# Patient Record
Sex: Female | Born: 1955 | Race: White | Hispanic: No | Marital: Married | Smoking: Never smoker
Health system: Southern US, Community
[De-identification: ages and names within clinical notes are randomized; demographics above are authoritative.]

---

## 1997-12-02 ENCOUNTER — Other Ambulatory Visit: Admission: RE | Admit: 1997-12-02 | Discharge: 1997-12-02 | Payer: Self-pay | Admitting: *Deleted

## 1998-07-29 ENCOUNTER — Ambulatory Visit (HOSPITAL_COMMUNITY): Admission: RE | Admit: 1998-07-29 | Discharge: 1998-07-29 | Payer: Self-pay | Admitting: Family Medicine

## 1998-07-29 ENCOUNTER — Encounter: Payer: Self-pay | Admitting: Family Medicine

## 1998-12-08 ENCOUNTER — Other Ambulatory Visit: Admission: RE | Admit: 1998-12-08 | Discharge: 1998-12-08 | Payer: Self-pay | Admitting: *Deleted

## 1999-12-14 ENCOUNTER — Other Ambulatory Visit: Admission: RE | Admit: 1999-12-14 | Discharge: 1999-12-14 | Payer: Self-pay | Admitting: Obstetrics and Gynecology

## 2001-01-29 ENCOUNTER — Other Ambulatory Visit: Admission: RE | Admit: 2001-01-29 | Discharge: 2001-01-29 | Payer: Self-pay | Admitting: Obstetrics and Gynecology

## 2001-12-26 ENCOUNTER — Other Ambulatory Visit: Admission: RE | Admit: 2001-12-26 | Discharge: 2001-12-26 | Payer: Self-pay | Admitting: Obstetrics and Gynecology

## 2002-12-30 ENCOUNTER — Other Ambulatory Visit: Admission: RE | Admit: 2002-12-30 | Discharge: 2002-12-30 | Payer: Self-pay | Admitting: Obstetrics and Gynecology

## 2003-12-31 ENCOUNTER — Other Ambulatory Visit: Admission: RE | Admit: 2003-12-31 | Discharge: 2003-12-31 | Payer: Self-pay | Admitting: Obstetrics and Gynecology

## 2005-02-09 ENCOUNTER — Other Ambulatory Visit: Admission: RE | Admit: 2005-02-09 | Discharge: 2005-02-09 | Payer: Self-pay | Admitting: Obstetrics and Gynecology

## 2006-05-23 ENCOUNTER — Other Ambulatory Visit: Admission: RE | Admit: 2006-05-23 | Discharge: 2006-05-23 | Payer: Self-pay | Admitting: Obstetrics and Gynecology

## 2008-09-10 ENCOUNTER — Ambulatory Visit (HOSPITAL_COMMUNITY): Admission: RE | Admit: 2008-09-10 | Discharge: 2008-09-10 | Payer: Self-pay | Admitting: Obstetrics and Gynecology

## 2008-09-10 ENCOUNTER — Encounter (INDEPENDENT_AMBULATORY_CARE_PROVIDER_SITE_OTHER): Payer: Self-pay | Admitting: Obstetrics and Gynecology

## 2009-10-12 ENCOUNTER — Ambulatory Visit: Payer: Self-pay | Admitting: Vascular Surgery

## 2009-11-17 ENCOUNTER — Ambulatory Visit: Payer: Self-pay | Admitting: Vascular Surgery

## 2010-09-05 ENCOUNTER — Other Ambulatory Visit: Payer: Self-pay | Admitting: Dermatology

## 2010-10-19 LAB — CBC
HCT: 43.2 % (ref 36.0–46.0)
Hemoglobin: 14.5 g/dL (ref 12.0–15.0)
MCHC: 33.7 g/dL (ref 30.0–36.0)
MCV: 97.4 fL (ref 78.0–100.0)
Platelets: 155 10*3/uL (ref 150–400)
RBC: 4.43 MIL/uL (ref 3.87–5.11)
RDW: 13.2 % (ref 11.5–15.5)
WBC: 4 10*3/uL (ref 4.0–10.5)

## 2010-10-19 LAB — PREGNANCY, URINE: Preg Test, Ur: NEGATIVE

## 2010-10-26 ENCOUNTER — Other Ambulatory Visit: Payer: Self-pay | Admitting: Dermatology

## 2010-11-21 NOTE — Op Note (Signed)
NAME:  Patty [name], Patty [name] NO.:  [id number]   MEDICAL RECORD NO.:  [id number]          PATIENT TYPE:  AMB   LOCATION:  SDC                           FACILITY:  [facility]   PHYSICIAN:  Crist Fat. Rivard, M.D. DATE OF BIRTH:  03-Aug-1955   DATE OF PROCEDURE:  09/10/2008  DATE OF DISCHARGE:                               OPERATIVE REPORT   PREOPERATIVE DIAGNOSIS:  Dysfunctional uterine bleeding with endometrial  polyps.   POSTOPERATIVE DIAGNOSIS:  Dysfunctional uterine bleeding with  endometrial polyps.   ANESTHESIA:  IV sedation and paracervical block.   PROCEDURES:  Hysteroscopy, resection of endometrial polyps, and dilation  and curettage.   SURGEON:  Crist Fat. Rivard, MD.   ASSISTANT:  None.   ESTIMATED BLOOD LOSS:  Minimal.   PROCEDURE:  After being informed of the planned procedure with possible  complications including bleeding, infection, and injury to uterus and  possibly intra-abdominal organs, informed consent was obtained.  The  patient was taken to OR #7, given IV sedation and paracervical block  without any complication.  She was placed in the lithotomy position,  prepped and draped in a sterile fashion, and her bladder was emptied  with an in-and-out red rubber catheter.  Pelvic exam revealed a  retroverted uterus normal in size and shape, 2 normal adnexa.   A weighted speculum was inserted in the vagina.  Anterior lip of the  cervix was grasped with the tenaculum forceps replaced with a Jacobs  forceps due to a cervical laceration.  Uterus was sounded at 8.5 cm and  the cervix was easily dilated using Hegar dilator until #33, which  allows easy entry of operative hysteroscope.  With a perfusion of  sorbitol 3% at a maximum pressure of 80 mmHg, we are able to visualize  the entire uterine cavity with both tubal ostia.  We note a fundal polyp  measuring approximately 1 x 0.5 cm and 2 lower uterine segment polyps on  the posterior wall of the uterus  measuring respectively 0.5 and 0.8 cm.  Using the resectoscope, these polyps are easily removed and sent  separately to Pathology.  Hysteroscope was then removed and a sharp  curette was used to curette the rest of the endometrial cavity removing  a small amount of normal-appearing endometrium.  Instruments were then  removed and the cervical laceration was repaired with a running locked  suture of 3-0 Vicryl and a figure-of-eight stitch of 3-0 Vicryl.  Instrument and sponge count was complete x2.  Estimated blood loss was  minimal.  Water deficit was 50 mL.  The procedure was very well tolerated by the  patient who was taken to recovery room in a well and stable condition  and will be discharged home with instructions.   SPECIMENS:  Endometrial polyps and endometrial curettings sent to  Pathology.      Crist Fat Rivard, M.D.  Electronically Signed     SAR/MEDQ  D:  09/10/2008  T:  09/11/2008  Job:  [id number]

## 2010-11-21 NOTE — Consult Note (Signed)
NEW PATIENT CONSULTATION   Patty [name]  DOB:  [birth year]                                       10/12/2009  EAVWU#:[id number]   The patient presents today for evaluation of pain in her popliteal fossa  bilaterally with reticular varicosities.  She reports that she has had  treatment in the past by Dr. Marcy [name] with both sclerotherapy and at  one point did have ligation and avulsion of [name] small varix in her left  proximal calf.  She does not have any history of DVT and no significant  lower extremity swelling.  She has not had any bleeding from these.   PAST MEDICAL HISTORY:  Her past history is otherwise completely  unremarkable.  She did have polyps removed from her uterus [name] year ago.   SOCIAL HISTORY:  She is married with three children.  She works as [name]  Runner, broadcasting/film/video.   FAMILY HISTORY:  Is significant for varicosities in both maternal and  paternal grandparents.   REVIEW OF SYSTEMS:  No weight loss or weight gain.  Her weight is 140  pounds.  She is 5 feet 9 inches tall.  She denies any cardiac, vascular,  GI, GU symptoms.  She does report pain as discussed above with the  popliteal space with prolonged standing which she does as [name] Runner, broadcasting/film/video.  Neurologic, musculoskeletal, psychiatric, ENT, hematologic and skin are  all negative.   PHYSICAL EXAMINATION:  General:  [name] well-developed, well-nourished white  female appearing her stated age in no acute distress.  Vital signs:  Blood pressure is 103/64, pulse 57, respirations 14.  HEENT:  Normal.  Musculoskeletal:  No major deformities or cyanosis.  Neurological:  No  focal weakness or paresthesias.  Skin:  Without ulcers or rashes.  She  does not have any large varicosities and she does not have any swelling  or changes of venous hypertension.  She does have reticular varicosities  over the posterior popliteal pulses bilaterally.   She underwent noninvasive vascular laboratory studies in our office and  this  reveals some reflux in her right great saphenous vein although the  size is small and this does not in my opinion appear to be significant.  She does not have any reflux in her left great saphenous vein or on her  small saphenous veins on either side.   I discussed options with the patient.  She has achieved relief in the  past with symptom discomfort with sclerotherapy of these reticulars in  her posterior popliteal fossa.  I have recommended that we proceed with  sclerotherapy of these for symptom relief.  I explained that I do not  feel that there is any evidence of any dangerous situation and that  observation alone could be possible as well.  She does report that she  is having significant discomfort with this and wishes to proceed with  treatment.  We will schedule this with Patty Hoof, RN and I explained to  the patient that Patty [name] is doing the sclerotherapy in our office.  We will  plan this at her convenience.     Patty [name], M.D.  Electronically Signed   TFE/MEDQ  D:  10/12/2009  T:  10/13/2009  Job:  [id number]   cc:   Patty [name]

## 2010-11-21 NOTE — Procedures (Signed)
LOWER EXTREMITY VENOUS REFLUX EXAM   INDICATION:  Bilateral lower extremity pain and varicosities.   EXAM:  Using color-flow imaging and pulse Doppler spectral analysis, the  bilateral common femoral, superficial femoral, popliteal, posterior  tibial, greater and lesser saphenous veins are evaluated.  There is no  evidence suggesting deep venous insufficiency in the bilateral lower  extremities.   The bilateral saphenofemoral junctions are competent. The right GSV is  not competent with Reflux of >563milliseconds with the caliber as  described below.   The bilateral proximal short saphenous vein demonstrates competency.   GSV Diameter (used if found to be incompetent only)                                            Right    Left  Proximal Greater Saphenous Vein           0.70 cm  cm  Proximal-to-mid-thigh                     0.52 cm  cm  Mid thigh                                 0.36 cm  cm  Mid-distal thigh                          cm       cm  Distal thigh                              0.36 cm  cm  Knee                                      0.31 cm  cm   IMPRESSION:  1. Right greater saphenous vein Reflux is identified with the caliber      ranging from 0.31 cm to 0.70 cm knee to groin.  2. The right greater saphenous vein is not aneurysmal.  3. The right greater saphenous vein is not tortuous.  4. The deep venous system is competent.  5. The bilateral lesser saphenous veins are competent.  6. Incompetent perforator veins are identified and contributing to      varicosities.  The right posterior calf gastrocnemius incompetent      perforator measuring 0.48 cm.  The left medial distal thigh      measuring 0.36 cm.   ___________________________________________  Larina Earthly, M.D.   CJ/MEDQ  D:  10/12/2009  T:  10/12/2009  Job:  [id number]

## 2012-03-17 NOTE — Progress Notes (Signed)
Quick Note:  The recent mammogram is incomplete / abnormal suggesting a close follow-up / additional images. When is the next appointment to follow-up on this mammogram?  Please document in chart. ______ 

## 2012-03-18 ENCOUNTER — Telehealth: Payer: Self-pay

## 2012-03-18 NOTE — Telephone Encounter (Signed)
Message copied by Larwance Rote on Tue Mar 18, 2012  2:41 PM ------      Message from: Patty [name]      Created: Mon Mar 17, 2012  6:40 PM       The recent mammogram is incomplete / abnormal suggesting a close follow-up / additional images.      When is the next appointment to follow-up on this mammogram?       Please document in chart.

## 2012-03-18 NOTE — Telephone Encounter (Signed)
LMTC regarding f/u mmg.  ld

## 2012-03-19 ENCOUNTER — Telehealth: Payer: Self-pay

## 2012-03-19 NOTE — Telephone Encounter (Signed)
Pt states that she had u/s one week ago and Dr Yolanda Bonine said it was benign cyst and will recheck in March.  Will forward to SR for information.  ld

## 2012-06-18 ENCOUNTER — Ambulatory Visit (INDEPENDENT_AMBULATORY_CARE_PROVIDER_SITE_OTHER): Payer: BC Managed Care – PPO | Admitting: Obstetrics and Gynecology

## 2012-06-18 ENCOUNTER — Encounter: Payer: Self-pay | Admitting: Obstetrics and Gynecology

## 2012-06-18 VITALS — BP 82/66 | Wt 149.0 lb

## 2012-06-18 DIAGNOSIS — L0591 Pilonidal cyst without abscess: Secondary | ICD-10-CM | POA: Insufficient documentation

## 2012-06-18 DIAGNOSIS — Z124 Encounter for screening for malignant neoplasm of cervix: Secondary | ICD-10-CM

## 2012-06-18 DIAGNOSIS — Z01419 Encounter for gynecological examination (general) (routine) without abnormal findings: Secondary | ICD-10-CM

## 2012-06-18 MED ORDER — NYSTATIN-TRIAMCINOLONE 100000-0.1 UNIT/GM-% EX OINT: TOPICAL_OINTMENT | Freq: Three times a day (TID) | CUTANEOUS | Status: DC | PRN

## 2012-06-18 NOTE — Addendum Note (Signed)
Addended by: Silverio Lay on: 06/18/2012 11:36 AM   Modules accepted: Orders

## 2012-06-18 NOTE — Progress Notes (Signed)
The patient is not taking hormone replacement therapy The patient  is taking a Calcium supplement. Post-menopausal bleeding:no  Last Pap: was normal December  2012 Last mammogram: was normal August  2013 Last DEXA scan : na Last colonoscopy:normal May 2012  Urinary symptoms: none Normal bowel movements: Yes Reports abuse at home: No:   Pt reports no problems.  Possible irritation from sitting a lot.  No discharge.   Subjective:    Patty [name] is a 56 y.o. female No obstetric history on file. who presents for annual exam.  The patient has no complaints today.   The following portions of the patient's history were reviewed and updated as appropriate: allergies, current medications, past family history, past medical history, past social history, past surgical history and problem list.  Review of Systems Pertinent items are noted in HPI. Gastrointestinal:No change in bowel habits, no abdominal pain, no rectal bleeding Genitourinary:negative for dysuria, frequency, hematuria, nocturia and urinary incontinence    Objective:     BP 82/66  Wt 149 lb (67.586 kg)  Weight:  Wt Readings from Last 1 Encounters:  06/18/12 149 lb (67.586 kg)     BMI: There is no height on file to calculate BMI. General Appearance: Alert, appropriate appearance for age. No acute distress HEENT: Grossly normal Neck / Thyroid: Supple, no masses, nodes or enlargement Lungs: clear to auscultation bilaterally Back: No CVA tenderness Breast Exam: Normal to inspection and No masses or nodes.No dimpling, nipple retraction or discharge. Cardiovascular: Regular rate and rhythm. S1, S2, no murmur Gastrointestinal: Soft, non-tender, no masses or organomegaly Pelvic Exam: Vulva and vagina appear normal. Bimanual exam reveals normal uterus and adnexa.RV Rectovaginal: normal rectal, no masses Lymphatic Exam: Non-palpable nodes in neck, clavicular, axillary, or inguinal regions Skin: no rash or  abnormalities Neurologic: Normal gait and speech, no tremor  Psychiatric: Alert and oriented, appropriate affect.    Assessment:    Normal gyn exam    Plan:   mammogram pap smear return annually or prn Vaginal Irritation from sitting discussed Vitamin D and Calcium discussed  DEXA at age 26 reccommended   Silverio Lay MD

## 2012-06-20 ENCOUNTER — Other Ambulatory Visit: Payer: Self-pay

## 2012-06-20 LAB — PAP IG W/ RFLX HPV ASCU

## 2012-11-25 ENCOUNTER — Other Ambulatory Visit: Payer: Self-pay

## 2013-11-24 ENCOUNTER — Other Ambulatory Visit: Payer: Self-pay

## 2014-05-10 ENCOUNTER — Encounter: Payer: Self-pay | Admitting: Obstetrics and Gynecology

## 2014-05-21 ENCOUNTER — Other Ambulatory Visit: Payer: Self-pay

## 2014-08-13 ENCOUNTER — Ambulatory Visit: Payer: Self-pay | Admitting: Cardiology

## 2014-09-06 ENCOUNTER — Ambulatory Visit: Payer: Self-pay | Admitting: Cardiology

## 2014-09-14 ENCOUNTER — Encounter: Payer: Self-pay | Admitting: Cardiology

## 2017-09-10 ENCOUNTER — Ambulatory Visit: Payer: BC Managed Care – PPO | Admitting: Vascular Surgery

## 2017-09-10 ENCOUNTER — Encounter: Payer: Self-pay | Admitting: Vascular Surgery

## 2017-09-10 VITALS — BP 117/74 | HR 73 | Temp 98.0°F | Resp 16 | Ht 69.0 in | Wt 146.0 lb

## 2017-09-10 DIAGNOSIS — I83893 Varicose veins of bilateral lower extremities with other complications: Secondary | ICD-10-CM

## 2017-09-10 NOTE — Progress Notes (Signed)
[facility] [place]  Patient name: Patty [name] MRN: [id number] DOB: 1956-02-23 Sex: female  REASON FOR CONSULT: Valuation of varicose veins left greater than right  HPI: Patty [name] is a 62 y.o. female, who is here today for evaluation of lower extremity varicosities.  She has a past history of phlebectomy of a prominent varicosity in her left popliteal fossa.  She is now had progressive changes with marked venous varicosities extending throughout her left thigh down onto her calf as well.  She reports an achy sensation with these.  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  Of DVT and no bleeding.  She has worn compression in the past but not currently.  History reviewed. No pertinent past medical history.  Family History  Problem Relation Age of Onset  . Cancer Mother 78       brain tumor  . Cancer Father 67       colon, liver    SOCIAL HISTORY: Social History   Socioeconomic History  . Marital status: Married    Spouse name: Not on file  . Number of children: Not on file  . Years of education: Not on file  . Highest education level: Not on file  Social Needs  . Financial resource strain: Not on file  . Food insecurity - worry: Not on file  . Food insecurity - inability: Not on file  . Transportation needs - medical: Not on file  . Transportation needs - non-medical: Not on file  Occupational History  . Not on file  Tobacco Use  . Smoking status: Never Smoker  . Smokeless tobacco: Never Used  Substance and Sexual Activity  . Alcohol use: No  . Drug use: No  . Sexual activity: Yes  Other Topics Concern  . Not on file  Social History Narrative  . Not on file    No Known Allergies  Current Outpatient Medications  Medication Sig Dispense Refill  . nystatin-triamcinolone ointment (MYCOLOG) Apply topically 3 (three) times daily as needed. (Patient not taking: Reported on 09/10/2017) 60  g 0   No current facility-administered medications for this visit.     REVIEW OF SYSTEMS:  [X]  denotes positive finding, [ ]  denotes negative finding Cardiac  Comments:  Chest pain or chest pressure:    Shortness of breath upon exertion:    Short of breath when lying flat:    Irregular heart rhythm:        Vascular    Pain in calf, thigh, or hip brought on by ambulation:    Pain in feet at night that wakes you up from your sleep:     Blood clot in your veins:    Leg swelling:  x       Pulmonary    Oxygen at home:    Productive cough:     Wheezing:         Neurologic    Sudden weakness in arms or legs:     Sudden numbness in arms or legs:     Sudden onset of difficulty speaking or slurred speech:    Temporary loss of vision in one eye:     Problems with dizziness:         Gastrointestinal    Blood in stool:     Vomited blood:         Genitourinary    Burning when urinating:     Blood in urine:  Psychiatric    Major depression:         Hematologic    Bleeding problems:    Problems with blood clotting too easily:        Skin    Rashes or ulcers:        Constitutional    Fever or chills:      PHYSICAL EXAM: Vitals:   09/10/17 1014  BP: 117/74  Pulse: 73  Resp: 16  Temp: 98 F (36.7 C)  SpO2: 100%  Weight: 146 lb (66.2 kg)  Height: 5\' 9"  (1.753 m)    GENERAL: The patient is a well-nourished female, in no acute distress. The vital signs are documented above. CARDIOVASCULAR: 2+ dorsalis pedis pulses bilaterally heart varicosities over her left thigh and calf.  Scattered telangiectasia over both lower extremities PULMONARY: There is good air exchange  ABDOMEN: Soft and non-tender  MUSCULOSKELETAL: There are no major deformities or cyanosis. NEUROLOGIC: No focal weakness or paresthesias are detected. SKIN: There are no ulcers or rashes noted. PSYCHIATRIC: The patient has a normal affect.  DATA:  She did not have a formal venous reflux study  today.  I imaged her left great saphenous vein with SonoSite ultrasound.  This reveals extensive varicosities arising off and enlarged refluxing great saphenous vein  MEDICAL ISSUES: Explained the significance of this and need for elevation and compression.  We will fit her today with thigh-high graduated compression garments.  I will see her again in 3 months to determine if this is being adequate treatment for her pain.  She would be a candidate for laser ablation and stab phlebectomy   Larina Earthlyodd F. Dorisann Schwanke, MD FACS [facility] Office Tel [phone] Pager [phone]

## 2017-09-11 ENCOUNTER — Other Ambulatory Visit: Payer: Self-pay

## 2017-09-11 DIAGNOSIS — I83893 Varicose veins of bilateral lower extremities with other complications: Secondary | ICD-10-CM

## 2017-12-31 ENCOUNTER — Ambulatory Visit: Payer: BC Managed Care – PPO | Admitting: Vascular Surgery

## 2017-12-31 ENCOUNTER — Ambulatory Visit (HOSPITAL_COMMUNITY)
Admission: RE | Admit: 2017-12-31 | Discharge: 2017-12-31 | Disposition: A | Discharge status: Home / Self Care | Payer: BC Managed Care – PPO | Source: Ambulatory Visit | Attending: Vascular Surgery | Admitting: Vascular Surgery

## 2017-12-31 ENCOUNTER — Encounter: Payer: Self-pay | Admitting: Vascular Surgery

## 2017-12-31 VITALS — BP 109/64 | HR 68 | Temp 98.0°F | Resp 16 | Ht 69.0 in | Wt 146.0 lb

## 2017-12-31 DIAGNOSIS — I83893 Varicose veins of bilateral lower extremities with other complications: Secondary | ICD-10-CM | POA: Diagnosis present

## 2017-12-31 DIAGNOSIS — I825Z3 Chronic embolism and thrombosis of unspecified deep veins of distal lower extremity, bilateral: Secondary | ICD-10-CM | POA: Diagnosis not present

## 2017-12-31 NOTE — Progress Notes (Signed)
    [facility] [place]  Patient name: Patty [name] MRN: [id number] DOB: 05/15/1956 Sex: female  REASON FOR VISIT: Left lower extremity varicosities left greater than right low up  HPI: Patty DiamondLaura A Oren is a 62 y.o. female here today for follow-up.  She has been compliant with her graduated compression garments but continues to have discomfort in her left leg with extensive varicosities.  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  History reviewed. No pertinent past medical history.  Family History  Problem Relation Age of Onset  . Cancer Mother 1168       brain tumor  . Cancer Father 7368       colon, liver    SOCIAL HISTORY: Social History   Tobacco Use  . Smoking status: Never Smoker  . Smokeless tobacco: Never Used  Substance Use Topics  . Alcohol use: No    No Known Allergies  Current Outpatient Medications  Medication Sig Dispense Refill  . nystatin-triamcinolone ointment (MYCOLOG) Apply topically 3 (three) times daily as needed. (Patient not taking: Reported on 09/10/2017) 60 g 0   No current facility-administered medications for this visit.     REVIEW OF SYSTEMS:  [X]  denotes positive finding, [ ]  denotes negative finding Cardiac  Comments:  Chest pain or chest pressure:    Shortness of breath upon exertion:    Short of breath when lying flat:    Irregular heart rhythm:        Vascular    Pain in calf, thigh, or hip brought on by ambulation:    Pain in feet at night that wakes you up from your sleep:     Blood clot in your veins:    Leg swelling:  x         PHYSICAL EXAM: Vitals:   12/31/17 1313  BP: 109/64  Pulse: 68  Resp: 16  Temp: 98 F (36.7 C)  SpO2: 99%  Weight: 146 lb (66.2 kg)  Height: 5\' 9"  (1.753 m)    GENERAL: The patient is a well-nourished female, in no acute distress. The vital signs are documented above. CARDIOVASCULAR: Plus dorsalis pedis  pulses bilaterally.  Extensive varicosities over her distal thigh and medial calf. PULMONARY: There is good air exchange  MUSCULOSKELETAL: There are no major deformities or cyanosis. NEUROLOGIC: No focal weakness or paresthesias are detected. SKIN: There are no ulcers or rashes noted. PSYCHIATRIC: The patient has a normal affect.  DATA:  Formal venous duplex today was reviewed with the patient.  I also reimage her veins with SonoSite ultrasound.  This does show extensive varicosities arising from her refluxing great saphenous vein from her saphenofemoral junction distally.  MEDICAL ISSUES: Failed conservative treatment.  I have recommended laser ablation of her left great saphenous vein and stab phlebectomy of multiple tributary varicosities.  She understands this is an outpatient procedure under local anesthesia.  She understands that Dr. Edilia Boickson will be doing the procedure and we will coordinate this around her work schedule    Larina Earthlyodd F. Vernal Rutan, MD [facility] Office Tel [phone] Pager [phone]

## 2018-03-19 ENCOUNTER — Ambulatory Visit: Payer: BC Managed Care – PPO | Admitting: Vascular Surgery

## 2018-03-19 ENCOUNTER — Encounter: Payer: Self-pay | Admitting: Vascular Surgery

## 2018-03-19 VITALS — BP 108/69 | HR 64 | Temp 98.0°F | Resp 16 | Ht 69.0 in | Wt 142.0 lb

## 2018-03-19 DIAGNOSIS — I83893 Varicose veins of bilateral lower extremities with other complications: Secondary | ICD-10-CM

## 2018-03-19 NOTE — Progress Notes (Signed)
Patient name: Patty [name] MRN: [id number] DOB: Dec 23, 1955 Sex: female  REASON FOR VISIT:   Follow-up of varicose veins.  HPI:   Patty [name] is a pleasant 62 y.o. female who was last seen in our office on 12/31/2017 by Dr. Arbie Cookey.  The patient was having significant discomfort in both lower extremities but especially on the left related to her varicose veins.  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  On exam she had extensive varicosities over her distal thigh and medial calf.  The patient had extensive varicosities on duplex arising from a refluxing great saphenous vein from the saphenofemoral junction distally.  She is referred back to consider endovenous laser ablation.  The patient continues to have significant aching pain in both lower extremities but especially on the left side.  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  The thigh-high compression stockings have not helped.  She also has been elevating her legs some takes ibuprofen as needed for pain.  Current Outpatient Medications  Medication Sig Dispense Refill  . nystatin-triamcinolone ointment (MYCOLOG) Apply topically 3 (three) times daily as needed. (Patient not taking: Reported on 09/10/2017) 60 g 0   No current facility-administered medications for this visit.     REVIEW OF SYSTEMS:  [X]  denotes positive finding, [ ]  denotes negative finding Vascular    Leg swelling    Cardiac    Chest pain or chest pressure:    Shortness of breath upon exertion:    Short of breath when lying flat:    Irregular heart rhythm:    Constitutional    Fever or chills:     PHYSICAL EXAM:   Vitals:   03/19/18 0843  BP: 108/69  Pulse: 64  Resp: 16  Temp: 98 F (36.7 C)  Weight: 142 lb (64.4 kg)  Height: 5\' 9"  (1.753 m)    GENERAL: The patient is a well-nourished female, in no acute distress. The vital signs are documented above. CARDIOVASCULAR: There is a regular rate  and rhythm. PULMONARY: There is good air exchange bilaterally without wheezing or rales. VASCULAR: Arterial: She has palpable pedal pulses. VENOUS: She has some large dilated varicose veins along the medial aspect of her left calf extending anteriorly to the lateral left calf.  She also has spider veins and reticular veins in the distal thighs bilaterally and proximal calves bilaterally. I did visualize the great saphenous vein on the left with the SonoSite in the proximal saphenous vein is significantly dilated with reflux.  In the mid thigh it gives off a large anterior branch.  The vein becomes smaller distally but is clearly dilated in the thigh.  DATA:   VENOUS REFLUX STUDY: I reviewed her previous venous reflux study.  On the left side there was no evidence of DVT.  There was some small chronic thrombus in the small saphenous vein in the proximal calf.  There was reflux in the deep system on the left involving the common femoral vein and popliteal vein.  There was reflux in the great saphenous vein on the left in the proximal thigh distal thigh and at the knee.  MEDICAL ISSUES:   CHRONIC VENOUS INSUFFICIENCY: This patient continues to have significant aching pain and heaviness in both lower extremities but especially on the left.  Patient has  CEAP clinical class II venous disease and has tried conservative measures.  For this reason I think she would be a candidate  for endovenous laser ablation of the left great saphenous vein in the thigh before the large branch becomes superficial.  Below that the vein becomes smaller.  She would also be a candidate for 10-20 stab phlebectomies and 1 unit of sclerotherapy.   I have discussed the indications for endovenous laser ablation of the left GSV, that is to lower the pressure in the veins and potentially help relieve the symptoms from venous hypertension. I have also discussed alternative options including conservative treatment with leg elevation,  compression therapy, exercise, avoiding prolonged sitting and standing, and weight management. I have discussed the potential complications of the procedure, including, but not limited to: bleeding, bruising, leg swelling, nerve injury, skin burns, significant pain from phlebitis, deep venous thrombosis, or failure of the vein to close.  I have also explained that venous insufficiency is a chronic disease, and that the patient is at risk for recurrent varicose veins in the future.  All of the patient's questions were encouraged and answered. I have discussed with the patient the indications for stab phlebectomy.  I have explained to the patient that that will have small scars from the stab incisions.  I explained that the other risks include leg swelling, bruising, bleeding, and phlebitis.  All the patient's questions were encouraged and answered and they are agreeable to proceed.  She also understands that chronic venous insufficiency is a chronic problem and we have also discussed some lifestyle changes to help prevent progression of her venous disease.  Waverly Ferrari [facility] [phone]

## 2018-03-25 ENCOUNTER — Other Ambulatory Visit: Payer: Self-pay | Admitting: *Deleted

## 2018-03-25 DIAGNOSIS — I83893 Varicose veins of bilateral lower extremities with other complications: Secondary | ICD-10-CM

## 2018-04-24 ENCOUNTER — Other Ambulatory Visit: Payer: BC Managed Care – PPO | Admitting: Vascular Surgery

## 2018-04-30 ENCOUNTER — Encounter (HOSPITAL_COMMUNITY): Payer: BC Managed Care – PPO

## 2018-04-30 ENCOUNTER — Ambulatory Visit: Payer: BC Managed Care – PPO | Admitting: Vascular Surgery

## 2018-06-11 ENCOUNTER — Telehealth: Payer: Self-pay | Admitting: *Deleted

## 2018-06-11 NOTE — Telephone Encounter (Signed)
Patient called in to let me know that she needs to cancel her laser ablation scheduled for 12/12. Last week her brother was diagnosed with DVT and PE's. He has some kind of clotting factor problem. She has been advised to get herself checked out. I have cancelled her and told her we would need to re-precert her for next year. She wants me to pencil her in on 2/27 at 11am, and she will be back in touch after seeing her hematologist.

## 2018-06-19 ENCOUNTER — Other Ambulatory Visit: Payer: BC Managed Care – PPO | Admitting: Vascular Surgery

## 2018-06-26 ENCOUNTER — Encounter (HOSPITAL_COMMUNITY): Payer: BC Managed Care – PPO

## 2018-06-26 ENCOUNTER — Ambulatory Visit: Payer: BC Managed Care – PPO | Admitting: Vascular Surgery

## 2018-08-15 ENCOUNTER — Encounter: Payer: Self-pay | Admitting: Hematology

## 2018-08-15 ENCOUNTER — Telehealth: Payer: Self-pay | Admitting: Hematology

## 2018-08-15 NOTE — Telephone Encounter (Signed)
A new patient appt has been scheduled for the pt to see Dr. Candise Che on 3/4 at 10am. Pt has agreed tot he appt date and time. Aware to arrive 30 minutes early. Letter mailed.

## 2018-09-10 ENCOUNTER — Telehealth: Payer: Self-pay | Admitting: Hematology

## 2018-09-10 ENCOUNTER — Inpatient Hospital Stay: Payer: BC Managed Care – PPO | Attending: Hematology | Admitting: Hematology

## 2018-09-10 ENCOUNTER — Telehealth: Payer: Self-pay | Admitting: Oncology

## 2018-09-10 NOTE — Telephone Encounter (Signed)
Per MD request do not reschedule patient appt without his permission.

## 2018-09-10 NOTE — Telephone Encounter (Signed)
Created in error

## 2018-09-12 ENCOUNTER — Telehealth: Payer: Self-pay | Admitting: Hematology

## 2018-09-12 NOTE — Telephone Encounter (Signed)
Pt cld stating she forgot about her appt on 3/4 with Dr. Candise Che. Pt has been rescheduled to see Dr. Candise Che on 3/23 at 1pm.

## 2018-09-25 ENCOUNTER — Telehealth: Payer: Self-pay | Admitting: Hematology

## 2018-09-25 NOTE — Telephone Encounter (Signed)
Pt cld to reschedule appt with Dr. Candise Che on 5/4 at 1pm.

## 2018-09-29 ENCOUNTER — Inpatient Hospital Stay: Payer: BC Managed Care – PPO | Admitting: Hematology

## 2018-11-06 ENCOUNTER — Telehealth: Payer: Self-pay | Admitting: Hematology

## 2018-11-06 NOTE — Telephone Encounter (Signed)
Pt has cld to reschedule her appt to 6/4 at 1pm d/t COVID-19

## 2018-11-10 ENCOUNTER — Encounter: Payer: BC Managed Care – PPO | Admitting: Hematology

## 2018-12-10 ENCOUNTER — Other Ambulatory Visit: Payer: BC Managed Care – PPO

## 2018-12-10 ENCOUNTER — Encounter: Payer: BC Managed Care – PPO | Admitting: Hematology

## 2019-10-27 ENCOUNTER — Telehealth: Payer: Self-pay | Admitting: Hematology

## 2019-10-27 NOTE — Telephone Encounter (Signed)
Received a new hem referral from Dr. Azucena Cecil for fhx of blood clots. Patty [name] has been cld and scheduled to see Dr. Azucena Cecil on 5/17 at 1pm. Pt aware to arrive 15 minutes early.

## 2019-11-23 ENCOUNTER — Other Ambulatory Visit: Payer: Self-pay

## 2019-11-23 ENCOUNTER — Inpatient Hospital Stay: Payer: BC Managed Care – PPO | Attending: Hematology | Admitting: Hematology

## 2019-11-23 ENCOUNTER — Inpatient Hospital Stay: Payer: BC Managed Care – PPO

## 2019-11-23 VITALS — BP 137/74 | HR 64 | Temp 98.3°F | Resp 18 | Ht 69.0 in | Wt 147.4 lb

## 2019-11-23 DIAGNOSIS — Z148 Genetic carrier of other disease: Secondary | ICD-10-CM | POA: Insufficient documentation

## 2019-11-23 DIAGNOSIS — Z8249 Family history of ischemic heart disease and other diseases of the circulatory system: Secondary | ICD-10-CM

## 2019-11-23 DIAGNOSIS — Z800 Family history of malignant neoplasm of digestive organs: Secondary | ICD-10-CM | POA: Insufficient documentation

## 2019-11-23 DIAGNOSIS — Z808 Family history of malignant neoplasm of other organs or systems: Secondary | ICD-10-CM | POA: Insufficient documentation

## 2019-11-23 DIAGNOSIS — Z8719 Personal history of other diseases of the digestive system: Secondary | ICD-10-CM | POA: Insufficient documentation

## 2019-11-23 DIAGNOSIS — Z832 Family history of diseases of the blood and blood-forming organs and certain disorders involving the immune mechanism: Secondary | ICD-10-CM | POA: Insufficient documentation

## 2019-11-23 DIAGNOSIS — D6869 Other thrombophilia: Secondary | ICD-10-CM | POA: Diagnosis present

## 2019-11-23 DIAGNOSIS — I8390 Asymptomatic varicose veins of unspecified lower extremity: Secondary | ICD-10-CM | POA: Diagnosis not present

## 2019-11-23 DIAGNOSIS — D6859 Other primary thrombophilia: Secondary | ICD-10-CM

## 2019-11-23 LAB — CMP (CANCER CENTER ONLY)
ALT: 21 U/L (ref 0–44)
AST: 25 U/L (ref 15–41)
Albumin: 4.5 g/dL (ref 3.5–5.0)
Alkaline Phosphatase: 69 U/L (ref 38–126)
Anion gap: 8 (ref 5–15)
BUN: 8 mg/dL (ref 8–23)
CO2: 30 mmol/L (ref 22–32)
Calcium: 9.8 mg/dL (ref 8.9–10.3)
Chloride: 104 mmol/L (ref 98–111)
Creatinine: 0.77 mg/dL (ref 0.44–1.00)
GFR, Est AFR Am: >60 mL/min (ref >60)
GFR, Estimated: >60 mL/min (ref >60)
Glucose, Bld: 94 mg/dL (ref 70–99)
Potassium: 3.9 mmol/L (ref 3.5–5.1)
Sodium: 142 mmol/L (ref 135–145)
Total Bilirubin: 1 mg/dL (ref 0.3–1.2)
Total Protein: 7.6 g/dL (ref 6.5–8.1)

## 2019-11-23 LAB — CBC WITH DIFFERENTIAL/PLATELET
Abs Immature Granulocytes: 0.01 10*3/uL (ref 0.00–0.07)
Basophils Absolute: 0.1 10*3/uL (ref 0.0–0.1)
Basophils Relative: 2 %
Eosinophils Absolute: 0.2 10*3/uL (ref 0.0–0.5)
Eosinophils Relative: 4 %
HCT: 40.7 % (ref 36.0–46.0)
Hemoglobin: 13.7 g/dL (ref 12.0–15.0)
Immature Granulocytes: 0 %
Lymphocytes Relative: 27 %
Lymphs Abs: 1.1 10*3/uL (ref 0.7–4.0)
MCH: 31.1 pg (ref 26.0–34.0)
MCHC: 33.7 g/dL (ref 30.0–36.0)
MCV: 92.5 fL (ref 80.0–100.0)
Monocytes Absolute: 0.3 10*3/uL (ref 0.1–1.0)
Monocytes Relative: 7 %
Neutro Abs: 2.5 10*3/uL (ref 1.7–7.7)
Neutrophils Relative %: 60 %
Platelets: 179 10*3/uL (ref 150–400)
RBC: 4.4 MIL/uL (ref 3.87–5.11)
RDW: 12.5 % (ref 11.5–15.5)
WBC: 4.1 10*3/uL (ref 4.0–10.5)
nRBC: 0 % (ref 0.0–0.2)

## 2019-11-23 LAB — D-DIMER, QUANTITATIVE: D-Dimer, Quant: 0.27 ug/mL-FEU (ref 0.00–0.50)

## 2019-11-23 LAB — ANTITHROMBIN III: AntiThromb III Func: 114 % (ref 75–120)

## 2019-11-23 NOTE — Patient Instructions (Signed)
Thank you for choosing [facility] to provide your oncology and hematology care.   Should you have questions after your visit to the [facility] ([facility]), please contact this office at [phone] between 8:30 AM and 4:30 PM.  Voice mails left after 4:00 PM may not be returned until the following business day.  Calls received after 4:30 PM will be answered by an off-site Nurse Triage Line.    Prescription Refills:  Please have your pharmacy contact us directly for most prescription requests.  Contact the office directly for refills of narcotics (pain medications). Allow 48-72 hours for refills.  Appointments: Please contact the [facility] scheduling department [phone] for questions regarding [facility] appointment scheduling.  Contact the schedulers with any scheduling changes so that your appointment can be rescheduled in a timely manner.   Central Scheduling for [place] [phone] - Call to schedule procedures such as PET scans, CT scans, MRI, Ultrasound, etc.  To afford each patient quality time with our providers, please arrive 30 minutes before your scheduled appointment time.  If you arrive late for your appointment, you may be asked to reschedule.  We strive to give you quality time with our providers, and arriving late affects you and other patients whose appointments are after yours. If you are a no show for multiple scheduled visits, you may be dismissed from the clinic at the providers discretion.     Resources: [facility] Social Workers [phone] for additional information on assistance programs or assistance connecting with community support programs   [place] DSS  336-641-3447: Information regarding food stamps, Medicaid, and utility assistance [place] 336-333-6589   [place] [employer]'s shared-ride transportation service for eligible riders who have a disability that prevents them from riding the fixed route bus.   Medicare Rights Center  800-333-4114 Helps people with Medicare understand their rights and benefits, navigate the Medicare system, and secure the quality healthcare they deserve [employer] 800-227-2345 Assists patients locate various types of support and financial assistance Cancer Care: 1-800-813-HOPE (4673) Provides financial assistance, online support groups, medication/co-pay assistance.   Transportation Assistance for appointments at [facility]: Transportation Coordinator [phone]  Again, thank you for choosing [facility] for your care.       

## 2019-11-23 NOTE — Progress Notes (Signed)
HEMATOLOGY/ONCOLOGY CONSULTATION NOTE  Date of Service: 11/23/2019  Patient Care Team: System, Pcp Not In as PCP - General  CHIEF COMPLAINTS/PURPOSE OF CONSULTATION:  FHx of blood clots   HISTORY OF PRESENTING ILLNESS:   Patty [name] is a wonderful 64 y.o. female who has been referred to [place] by Dr. Moreen Fowler for evaluation and management of family history of blood clots. The pt reports that she is doing well overall.   The pt reports that she was scheduled to have laser surgery for varicose veins last year but did not go through with the procedure after her brother was found to have a clotting disorder. Her brother who is a Insurance underwriter and was otherwise very healthy had a massive PE. He tested positive for PAI-1 4G/5G genotype, but she is unsure if he was heterozygous or homozygous for the gene. He was 64 years old at the time. Pt's brother also had a PE after a shoulder surgery 10+ years prior. Pt's father also had a blood clot in the back of his knee whole undergoing chemotherapy for Colon Cancer. Pt denies any other blood clotting or bleeding disorders in the family. She has no history of blood clots and had lower extremity US completed as a part of work up before her scheduled laser surgery.   She had a pilonidal cyst as a teenager. Pt has had three children, all normal vaginal deliveries and no miscarriages. Pt had sclerotherapy completed after her births due to significant varicose veins. She has never been on hormonal birth control. She has no known medication allergies. Pt is currently up-to-date with age-appropriate cancer screenings.   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   On PMHx the pt reports Varicose veins, Pilonidal cyst, Hyperlipidemia, Colonic polyps, Hemorrhoids, Chronic idiopathic constipation. On Social Hx the  pt reports that she is a non-smoker and does not drink alcohol.  On Family Hx the pt reports a brother who has had two PE and a PAI-1 4G/5G mutation. Her father also had a DVT while undergoing chemotherapy.    MEDICAL HISTORY:   Elevated LDL cholesterol level  Hemorrhage of rectum and anus Chronic idiopathic constipation Hemorrhoids  Hyperlipidemia Colonic polyps    SURGICAL HISTORY: No past surgical history on file.  SOCIAL HISTORY: Social History   Socioeconomic History  . Marital status: Married    Spouse name: Not on file  . Number of children: Not on file  . Years of education: Not on file  . Highest education level: Not on file  Occupational History  . Not on file  Tobacco Use  . Smoking status: Never Smoker  . Smokeless tobacco: Never Used  Substance and Sexual Activity  . Alcohol use: No  . Drug use: No  . Sexual activity: Yes  Other Topics Concern  . Not on file  Social History Narrative  . Not on file   Social Determinants of Health   Financial Resource Strain:   . Difficulty of Paying Living Expenses:   Food Insecurity:   . Worried About Charity fundraiser in the Last Year:   . Arboriculturist in the Last Year:   Transportation Needs:   . Film/video editor (Medical):   Marland Kitchen Lack of Transportation (Non-Medical):   Physical Activity:   . Days of Exercise per Week:   . Minutes of  Exercise per Session:   Stress:   . Feeling of Stress :   Social Connections:   . Frequency of Communication with Friends and Family:   . Frequency of Social Gatherings with Friends and Family:   . Attends Religious Services:   . Active Member of Clubs or Organizations:   . Attends Banker Meetings:   Marland Kitchen Marital Status:   Intimate Partner Violence:   . Fear of Current or Ex-Partner:   . Emotionally Abused:   Marland Kitchen Physically Abused:   . Sexually Abused:     FAMILY HISTORY: Family History  Problem Relation Age of Onset  . Cancer Mother 3       brain  tumor  . Cancer Father 54       colon, liver    ALLERGIES:  has No Known Allergies.  MEDICATIONS:  Current Outpatient Medications  Medication Sig Dispense Refill  . nystatin-triamcinolone ointment (MYCOLOG) Apply topically 3 (three) times daily as needed. (Patient not taking: Reported on 09/10/2017) 60 g 0   No current facility-administered medications for this visit.    REVIEW OF SYSTEMS:    10 Point review of Systems was done is negative except as noted above.  PHYSICAL EXAMINATION: ECOG PERFORMANCE STATUS: 0 - Asymptomatic  . Vitals:   11/23/19 1326  BP: 137/74  Pulse: 64  Resp: 18  Temp: 98.3 F (36.8 C)  SpO2: 100%   Filed Weights   11/23/19 1326  Weight: 147 lb 6.4 oz (66.9 kg)   .Body mass index is 21.77 kg/m.  GENERAL:alert, in no acute distress and comfortable SKIN: no acute rashes, no significant lesions EYES: conjunctiva are pink and non-injected, sclera anicteric OROPHARYNX: MMM, no exudates, no oropharyngeal erythema or ulceration NECK: supple, no JVD LYMPH:  no palpable lymphadenopathy in the cervical, axillary or inguinal regions LUNGS: clear to auscultation b/l with normal respiratory effort HEART: regular rate & rhythm ABDOMEN:  normoactive bowel sounds , non tender, not distended. Extremity: no pedal edema PSYCH: alert & oriented x 3 with fluent speech NEURO: no focal motor/sensory deficits  LABORATORY DATA:  I have reviewed the data as listed  . CBC Latest Ref Rng & Units 09/10/2008  WBC 4.0 - 10.5 K/uL 4.0  Hemoglobin 12.0 - 15.0 g/dL 90.2  Hematocrit 40.9 - 46.0 % 43.2  Platelets 150 - 400 K/uL 155    .No flowsheet data found.   RADIOGRAPHIC STUDIES: I have personally reviewed the radiological images as listed and agreed with the findings in the report. No results found.  ASSESSMENT & PLAN:   64 yo with   1.  PAI-1 Gene Heterozygous for 4G/5G mutation    2. Varicose veins   3. 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 For homozygous state would recommend heart healthy diet and low-dose Aspirin daily. -Advised pt that varicose veins can be a risk factor for blood clots -Not unreasonable for pt to have varicose vein ablation procedure. Advised pt that PAI-1 Heterozygous 4G/5G mutation does carry a small risk of blood clots but this mutation has been asymptomatic thus far. Pt may begin low-dose Aspirin after the procedure at the discretion of the physicians performing  the procedure.  -Not unreasonable to complete a full hypercoagulable w/u to r/o other hypercoagulable conditions -Recommend pt stay up-to-date with age-appropriate cancer screenings with PCP -Will see back in 2 weeks via phone -Will get labs today    FOLLOW UP: Labs today Phone visit in 2 weeks with Dr Candise Che   All of the patients questions were answered with apparent satisfaction. The patient knows to call the clinic with any problems, questions or concerns.  I spent counseling the patient face to face. The total time spent in the appointment was 45 minutes and more than 50% was on counseling and direct patient cares.    Wyvonnia Lora MD MS AAHIVMS [facility] Hematology/Oncology Physician [facility]  (Office):       [phone] (Work cell):  [phone] (Fax):           [phone]  11/23/2019 9:26 AM  I, Carollee Herter, am acting as a scribe for Dr. Wyvonnia Lora.   .I have reviewed the above documentation for accuracy and completeness, and I agree with the above. Johney Maine MD

## 2019-11-24 LAB — PROTEIN S ACTIVITY: Protein S Activity: 101 % (ref 63–140)

## 2019-11-24 LAB — CARDIOLIPIN ANTIBODIES, IGG, IGM, IGA
Anticardiolipin IgA: <9 APL U/mL (ref 0–11)
Anticardiolipin IgG: <9 GPL U/mL (ref 0–14)
Anticardiolipin IgM: 13 MPL U/mL — ABNORMAL HIGH (ref 0–12)

## 2019-11-24 LAB — BETA-2-GLYCOPROTEIN I ABS, IGG/M/A
Beta-2 Glyco I IgG: <9 GPI IgG units (ref 0–20)
Beta-2-Glycoprotein I IgA: <9 GPI IgA units (ref 0–25)
Beta-2-Glycoprotein I IgM: 13 GPI IgM units (ref 0–32)

## 2019-11-24 LAB — PROTEIN C ACTIVITY: Protein C Activity: 135 % (ref 73–180)

## 2019-11-24 LAB — LUPUS ANTICOAGULANT PANEL
DRVVT: 33.4 s (ref 0.0–47.0)
PTT Lupus Anticoagulant: 35.4 s (ref 0.0–51.9)

## 2019-11-24 LAB — PROTEIN C, TOTAL: Protein C, Total: 124 % (ref 60–150)

## 2019-11-24 LAB — PROTEIN S, ANTIGEN, FREE: Protein S Ag, Free: 121 % (ref 57–157)

## 2019-11-25 LAB — FACTOR 5 LEIDEN

## 2019-11-27 LAB — PROTHROMBIN GENE MUTATION

## 2019-12-08 ENCOUNTER — Inpatient Hospital Stay: Payer: BC Managed Care – PPO | Attending: Hematology | Admitting: Hematology

## 2019-12-08 DIAGNOSIS — D6859 Other primary thrombophilia: Secondary | ICD-10-CM

## 2019-12-08 DIAGNOSIS — Z1589 Genetic susceptibility to other disease: Secondary | ICD-10-CM | POA: Insufficient documentation

## 2019-12-08 DIAGNOSIS — Z8249 Family history of ischemic heart disease and other diseases of the circulatory system: Secondary | ICD-10-CM | POA: Diagnosis not present

## 2019-12-08 NOTE — Progress Notes (Signed)
HEMATOLOGY/ONCOLOGY CONSULTATION NOTE  Date of Service: 12/08/2019  Patient Care Team: System, Pcp Not In as PCP - General  CHIEF COMPLAINTS/PURPOSE OF CONSULTATION:  FHx of blood clots   HISTORY OF PRESENTING ILLNESS:   Patty [name] is a wonderful 64 y.o. female who has been referred to [place] by Dr. Azucena Cecil for evaluation and management of family history of blood clots. The pt reports that she is doing well overall.   The pt reports that she was scheduled to have laser surgery for varicose veins last year but did not go through with the procedure after her brother was found to have a clotting disorder. Her brother who is a Occupational hygienist and was otherwise very healthy had a massive PE. He tested positive for PAI-1 4G/5G genotype, but she is unsure if he was heterozygous or homozygous for the gene. He was 64 years old at the time. Pt's brother also had a PE after a shoulder surgery 10+ years prior. Pt's father also had a blood clot in the back of his knee whole undergoing chemotherapy for Colon Cancer. Pt denies any other blood clotting or bleeding disorders in the family. She has no history of blood clots and had lower extremity US completed as a part of work up before her scheduled laser surgery.   She had a pilonidal cyst as a teenager. Pt has had three children, all normal vaginal deliveries and no miscarriages. Pt had sclerotherapy completed after her births due to significant varicose veins. She has never been on hormonal birth control. She has no known medication allergies. Pt is currently up-to-date with age-appropriate cancer screenings.   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   On PMHx the pt reports Varicose veins, Pilonidal cyst, Hyperlipidemia, Colonic polyps, Hemorrhoids, Chronic idiopathic constipation. On Social Hx the  pt reports that she is a non-smoker and does not drink alcohol.  On Family Hx the pt reports a brother who has had two PE and a PAI-1 4G/5G mutation. Her father also had a DVT while undergoing chemotherapy.   INTERVAL HISTORY:   I connected with  Virl Diamond on 12/08/19 by telephone and verified that I am speaking with the correct person using two identifiers.   I discussed the limitations of evaluation and management by telemedicine. The patient expressed understanding and agreed to proceed.  Other persons participating in the visit and their role in the encounter:       -Carollee Herter, Medical Scribe  Patient's location: Home Provider's location: [facility] at [employer]  Patty [name] is a wonderful 64 y.o. female who is here for evaluation and management of family history of blood clots. The patient's last visit with [place] was on 11/23/2019. The pt reports that she is doing well overall.  The pt reports that she has been well and has no new concerns.   Lab results (11/23/19) of CBC w/diff and CMP is as follows: all values are WNL. 11/23/2019 Lupus anticoagulant panel is as follows: PTT Lupus Anticoagulant at 35.4, DRVVT at 33.4 11/23/2019 Cardiolipin antibodies, IgG, IgM, IgA is as follows: Anticardiolipin IgG at <9, Anticardiolipin IgM at 13, Anticardiolipin IgA at <9 11/23/2019 Beta-2-glycoprotein i abs, IgG/M/A is as follows: Beta-2 Glyco I IgG at <9, Beta-2-Glycoprotein I IgM at 13, Beta-2-Glycoprotein I IgA at <9 11/23/2019 D-dimer, Quant at 0.27 11/23/2019 Antithrombin III Func at  114 11/23/2019 Protein S Ag, Free at 121 11/23/2019 Protein S Activity at 101 11/23/2019 Protein C, Total at 124 11/23/2019 Protein C Activity at 135 11/23/2019 Prothrombin gene mutation shows "No mutation identified". 11/23/2019 Factor 5 leiden is "Negative (no mutation found)"  On review of systems, pt denies any other symptoms.    MEDICAL HISTORY:   Elevated LDL cholesterol level    Hemorrhage of rectum and anus Chronic idiopathic constipation Hemorrhoids  Hyperlipidemia Colonic polyps    SURGICAL HISTORY: No past surgical history on file.  SOCIAL HISTORY: Social History   Socioeconomic History  . Marital status: Married    Spouse name: Not on file  . Number of children: Not on file  . Years of education: Not on file  . Highest education level: Not on file  Occupational History  . Not on file  Tobacco Use  . Smoking status: Never Smoker  . Smokeless tobacco: Never Used  Substance and Sexual Activity  . Alcohol use: No  . Drug use: No  . Sexual activity: Yes  Other Topics Concern  . Not on file  Social History Narrative  . Not on file   Social Determinants of Health   Financial Resource Strain:   . Difficulty of Paying Living Expenses:   Food Insecurity:   . Worried About Charity fundraiser in the Last Year:   . Arboriculturist in the Last Year:   Transportation Needs:   . Film/video editor (Medical):   Marland Kitchen Lack of Transportation (Non-Medical):   Physical Activity:   . Days of Exercise per Week:   . Minutes of Exercise per Session:   Stress:   . Feeling of Stress :   Social Connections:   . Frequency of Communication with Friends and Family:   . Frequency of Social Gatherings with Friends and Family:   . Attends Religious Services:   . Active Member of Clubs or Organizations:   . Attends Archivist Meetings:   Marland Kitchen Marital Status:   Intimate Partner Violence:   . Fear of Current or Ex-Partner:   . Emotionally Abused:   Marland Kitchen Physically Abused:   . Sexually Abused:     FAMILY HISTORY: Family History  Problem Relation Age of Onset  . Cancer Mother 33       brain tumor  . Cancer Father 24       colon, liver    ALLERGIES:  is allergic to no known allergies.  MEDICATIONS:  No current outpatient medications on file.   No current facility-administered medications for this visit.    REVIEW OF SYSTEMS:   A 10+ POINT  REVIEW OF SYSTEMS WAS OBTAINED including neurology, dermatology, psychiatry, cardiac, respiratory, lymph, extremities, GI, GU, Musculoskeletal, constitutional, breasts, reproductive, HEENT.  All pertinent positives are noted in the HPI.  All others are negative.   PHYSICAL EXAMINATION: ECOG PERFORMANCE STATUS: 0 - Asymptomatic  .Telehealth visit  LABORATORY DATA:  I have reviewed the data as listed  . CBC Latest Ref Rng & Units 11/23/2019 09/10/2008  WBC 4.0 - 10.5 K/uL 4.1 4.0  Hemoglobin 12.0 - 15.0 g/dL 13.7 14.5  Hematocrit 36.0 - 46.0 % 40.7 43.2  Platelets 150 - 400 K/uL 179 155    . CMP Latest Ref Rng & Units 11/23/2019  Glucose 70 - 99 mg/dL 94  BUN 8 - 23 mg/dL 8  Creatinine 0.44 - 1.00 mg/dL 0.77  Sodium 135 - 145 mmol/L 142  Potassium 3.5 - 5.1 mmol/L  3.9  Chloride 98 - 111 mmol/L 104  CO2 22 - 32 mmol/L 30  Calcium 8.9 - 10.3 mg/dL 9.8  Total Protein 6.5 - 8.1 g/dL 7.6  Total Bilirubin 0.3 - 1.2 mg/dL 1.0  Alkaline Phos 38 - 126 U/L 69  AST 15 - 41 U/L 25  ALT 0 - 44 U/L 21     RADIOGRAPHIC STUDIES: I have personally reviewed the radiological images as listed and agreed with the findings in the report. No results found.  ASSESSMENT & PLAN:   65 yo with   1.  PAI-1 Gene Heterozygous for 4G/5G mutation    2. Varicose veins   3. FHx of severe Pulmonary embolism in her brother  PLAN: -Discussed pt labwork, 11/23/2019; blood counts and chemistries are nml -Discussed D-dimer Quant, Antithrombin III Func, Protein S Ag Free, Protein S Activity, Protein C Total, Protein C Activity, Beta-2-glycoprotein i abs IgG/M/A, and Lupus anticoagulant panel are all WNL -Discussed Prothrombin gene mutation and Factor 5 leiden are "Negative" -Discussed Cardiolipin antibodies, IgG, IgM, IgA is as follows: Anticardiolipin IgG at <9, Anticardiolipin IgM at 13, Anticardiolipin IgA at <9 -Advised pt that the hypercogulable w/u did not unearth any significant, additional risk  factors for blood clots.  -Advised pt that varicose veins carries a risk of causing blood clots and her heterozygous PAI-1 mutation may also carry a small risk of increased blood clots  -Due to pt's lack of PMHx of blood clots or FHx of heart disease, not a very strong recommendation for prophylactic baby Aspirin.  -Recommend pt stay active, well hydrated, and a heart healthy diet. No unreasonable to take a low-dose Aspirin daily if pt chooses too.  -Will see back as needed   FOLLOW UP: RTC with Dr. Candise Che as needed   The total time spent in the appt was 20 minutes and more than 50% was on counseling and direct patient cares.  All of the patient's questions were answered with apparent satisfaction. The patient knows to call the clinic with any problems, questions or concerns.    Wyvonnia Lora MD MS AAHIVMS [facility] Hematology/Oncology Physician [facility]  (Office):       [phone] (Work cell):  [phone] (Fax):           [phone]  12/08/2019 11:53 AM  I, Carollee Herter, am acting as a scribe for Dr. Wyvonnia Lora.   .I have reviewed the above documentation for accuracy and completeness, and I agree with the above. Johney Maine MD

## 2020-09-10 ENCOUNTER — Emergency Department (HOSPITAL_COMMUNITY): Payer: BC Managed Care – PPO

## 2020-09-10 ENCOUNTER — Inpatient Hospital Stay (HOSPITAL_COMMUNITY)
Admission: EM | Admit: 2020-09-10 | Discharge: 2020-09-17 | DRG: 488 | Disposition: A | Discharge status: Home Health | Payer: BC Managed Care – PPO | Attending: Orthopedic Surgery | Admitting: Orthopedic Surgery

## 2020-09-10 ENCOUNTER — Other Ambulatory Visit: Payer: Self-pay

## 2020-09-10 ENCOUNTER — Inpatient Hospital Stay (HOSPITAL_COMMUNITY): Payer: BC Managed Care – PPO

## 2020-09-10 ENCOUNTER — Encounter (HOSPITAL_COMMUNITY): Payer: Self-pay

## 2020-09-10 DIAGNOSIS — Z23 Encounter for immunization: Secondary | ICD-10-CM

## 2020-09-10 DIAGNOSIS — Z681 Body mass index (BMI) 19 or less, adult: Secondary | ICD-10-CM

## 2020-09-10 DIAGNOSIS — R110 Nausea: Secondary | ICD-10-CM | POA: Diagnosis present

## 2020-09-10 DIAGNOSIS — Y9355 Activity, bike riding: Secondary | ICD-10-CM | POA: Diagnosis not present

## 2020-09-10 DIAGNOSIS — Z419 Encounter for procedure for purposes other than remedying health state, unspecified: Secondary | ICD-10-CM | POA: Diagnosis not present

## 2020-09-10 DIAGNOSIS — S83102A Unspecified subluxation of left knee, initial encounter: Secondary | ICD-10-CM | POA: Diagnosis present

## 2020-09-10 DIAGNOSIS — M62838 Other muscle spasm: Secondary | ICD-10-CM | POA: Diagnosis not present

## 2020-09-10 DIAGNOSIS — Z808 Family history of malignant neoplasm of other organs or systems: Secondary | ICD-10-CM

## 2020-09-10 DIAGNOSIS — S82142A Displaced bicondylar fracture of left tibia, initial encounter for closed fracture: Principal | ICD-10-CM | POA: Diagnosis present

## 2020-09-10 DIAGNOSIS — T148XXA Other injury of unspecified body region, initial encounter: Secondary | ICD-10-CM

## 2020-09-10 DIAGNOSIS — D62 Acute posthemorrhagic anemia: Secondary | ICD-10-CM | POA: Diagnosis not present

## 2020-09-10 DIAGNOSIS — Z20822 Contact with and (suspected) exposure to covid-19: Secondary | ICD-10-CM | POA: Diagnosis present

## 2020-09-10 DIAGNOSIS — Z800 Family history of malignant neoplasm of digestive organs: Secondary | ICD-10-CM

## 2020-09-10 DIAGNOSIS — S82143A Displaced bicondylar fracture of unspecified tibia, initial encounter for closed fracture: Secondary | ICD-10-CM | POA: Diagnosis present

## 2020-09-10 DIAGNOSIS — M25562 Pain in left knee: Secondary | ICD-10-CM | POA: Diagnosis present

## 2020-09-10 DIAGNOSIS — E559 Vitamin D deficiency, unspecified: Secondary | ICD-10-CM | POA: Diagnosis present

## 2020-09-10 DIAGNOSIS — M25552 Pain in left hip: Secondary | ICD-10-CM | POA: Diagnosis present

## 2020-09-10 LAB — BASIC METABOLIC PANEL
Anion gap: 9 (ref 5–15)
BUN: 12 mg/dL (ref 8–23)
CO2: 25 mmol/L (ref 22–32)
Calcium: 8.8 mg/dL — ABNORMAL LOW (ref 8.9–10.3)
Chloride: 101 mmol/L (ref 98–111)
Creatinine, Ser: 0.67 mg/dL (ref 0.44–1.00)
GFR, Estimated: >60 mL/min (ref >60)
Glucose, Bld: 127 mg/dL — ABNORMAL HIGH (ref 70–99)
Potassium: 4 mmol/L (ref 3.5–5.1)
Sodium: 135 mmol/L (ref 135–145)

## 2020-09-10 LAB — CBC WITH DIFFERENTIAL/PLATELET
Abs Immature Granulocytes: 0.02 10*3/uL (ref 0.00–0.07)
Basophils Absolute: 0 10*3/uL (ref 0.0–0.1)
Basophils Relative: 0 %
Eosinophils Absolute: 0 10*3/uL (ref 0.0–0.5)
Eosinophils Relative: 0 %
HCT: 37.7 % (ref 36.0–46.0)
Hemoglobin: 12.8 g/dL (ref 12.0–15.0)
Immature Granulocytes: 0 %
Lymphocytes Relative: 9 %
Lymphs Abs: 0.9 10*3/uL (ref 0.7–4.0)
MCH: 32.5 pg (ref 26.0–34.0)
MCHC: 34 g/dL (ref 30.0–36.0)
MCV: 95.7 fL (ref 80.0–100.0)
Monocytes Absolute: 0.6 10*3/uL (ref 0.1–1.0)
Monocytes Relative: 6 %
Neutro Abs: 7.8 10*3/uL — ABNORMAL HIGH (ref 1.7–7.7)
Neutrophils Relative %: 85 %
Platelets: 164 10*3/uL (ref 150–400)
RBC: 3.94 MIL/uL (ref 3.87–5.11)
RDW: 12.2 % (ref 11.5–15.5)
WBC: 9.3 10*3/uL (ref 4.0–10.5)
nRBC: 0 % (ref 0.0–0.2)

## 2020-09-10 LAB — PROTIME-INR
INR: 1.1 (ref 0.8–1.2)
Prothrombin Time: 13.3 seconds (ref 11.4–15.2)

## 2020-09-10 LAB — RESP PANEL BY RT-PCR (FLU A&B, COVID) ARPGX2
Influenza A by PCR: NEGATIVE
Influenza B by PCR: NEGATIVE
SARS Coronavirus 2 by RT PCR: NEGATIVE

## 2020-09-10 MED ORDER — HYDROCODONE-ACETAMINOPHEN 7.5-325 MG PO TABS: 1.0000 | ORAL_TABLET | ORAL | Status: DC | PRN

## 2020-09-10 MED ORDER — ACETAMINOPHEN 500 MG PO TABS
500.0000 mg | ORAL_TABLET | Freq: Four times a day (QID) | ORAL | Status: DC
  Administered 2020-09-10 – 2020-09-11 (×3): 500 mg via ORAL
  Filled 2020-09-10 (×3): qty 1

## 2020-09-10 MED ORDER — MORPHINE SULFATE (PF) 2 MG/ML IV SOLN
0.5000 mg | INTRAVENOUS | Status: DC | PRN
  Administered 2020-09-10 (×2): 0.5 mg via INTRAVENOUS
  Filled 2020-09-10: qty 1

## 2020-09-10 MED ORDER — ACETAMINOPHEN 325 MG PO TABS: 325.0000 mg | ORAL_TABLET | Freq: Four times a day (QID) | ORAL | Status: DC | PRN

## 2020-09-10 MED ORDER — METHOCARBAMOL 1000 MG/10ML IJ SOLN
500.0000 mg | Freq: Four times a day (QID) | INTRAVENOUS | Status: DC | PRN
  Filled 2020-09-10: qty 5

## 2020-09-10 MED ORDER — HYDROCODONE-ACETAMINOPHEN 5-325 MG PO TABS
1.0000 | ORAL_TABLET | ORAL | Status: DC | PRN
  Administered 2020-09-11: 1 via ORAL
  Filled 2020-09-10: qty 2

## 2020-09-10 MED ORDER — HYDROMORPHONE HCL 1 MG/ML IJ SOLN
0.5000 mg | INTRAMUSCULAR | Status: DC | PRN
  Administered 2020-09-10: 0.5 mg via INTRAVENOUS
  Filled 2020-09-10: qty 1

## 2020-09-10 MED ORDER — METHOCARBAMOL 500 MG PO TABS: 500.0000 mg | ORAL_TABLET | Freq: Four times a day (QID) | ORAL | Status: DC | PRN

## 2020-09-10 MED ORDER — FENTANYL CITRATE (PF) 100 MCG/2ML IJ SOLN
25.0000 ug | INTRAMUSCULAR | Status: DC | PRN
  Administered 2020-09-10: 25 ug via INTRAVENOUS
  Filled 2020-09-10: qty 2

## 2020-09-10 MED ORDER — LACTATED RINGERS IV SOLN: INTRAVENOUS | Status: DC

## 2020-09-10 MED ORDER — TETANUS-DIPHTH-ACELL PERTUSSIS 5-2.5-18.5 LF-MCG/0.5 IM SUSY
0.5000 mL | PREFILLED_SYRINGE | Freq: Once | INTRAMUSCULAR | Status: AC
  Administered 2020-09-10: 0.5 mL via INTRAMUSCULAR
  Filled 2020-09-10: qty 0.5
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
## 2020-09-10 NOTE — ED Notes (Signed)
Patient provided with water after provider approval. Nurse educated patient that patient will be npo after midnight. Patient understood.

## 2020-09-10 NOTE — ED Notes (Signed)
[facility] called for transport, states there will be a delay

## 2020-09-10 NOTE — ED Notes (Signed)
Patient to xray at this time
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
## 2020-09-10 NOTE — ED Notes (Signed)
Floor notified of patient's departure.
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
## 2020-09-11 ENCOUNTER — Encounter (HOSPITAL_COMMUNITY)
Admission: EM | Disposition: A | Discharge status: Home Health | Payer: Self-pay | Source: Home / Self Care | Attending: Orthopedic Surgery

## 2020-09-11 ENCOUNTER — Inpatient Hospital Stay (HOSPITAL_COMMUNITY): Payer: BC Managed Care – PPO

## 2020-09-11 ENCOUNTER — Inpatient Hospital Stay (HOSPITAL_COMMUNITY): Payer: BC Managed Care – PPO | Admitting: Registered Nurse

## 2020-09-11 HISTORY — PX: EXTERNAL FIXATION LEG: SHX1549

## 2020-09-11 LAB — CBC
HCT: 33.3 % — ABNORMAL LOW (ref 36.0–46.0)
Hemoglobin: 11.7 g/dL — ABNORMAL LOW (ref 12.0–15.0)
MCH: 32.5 pg (ref 26.0–34.0)
MCHC: 35.1 g/dL (ref 30.0–36.0)
MCV: 92.5 fL (ref 80.0–100.0)
Platelets: 131 10*3/uL — ABNORMAL LOW (ref 150–400)
RBC: 3.6 MIL/uL — ABNORMAL LOW (ref 3.87–5.11)

## 2020-09-11 LAB — SURGICAL PCR SCREEN
MRSA, PCR: NEGATIVE
Staphylococcus aureus: POSITIVE — AB

## 2020-09-11 LAB — HIV ANTIBODY (ROUTINE TESTING W REFLEX): HIV Screen 4th Generation wRfx: NONREACTIVE

## 2020-09-11 SURGERY — EXTERNAL FIXATION, LOWER EXTREMITY
Anesthesia: General | Site: Knee | Laterality: Left

## 2020-09-11 MED ORDER — ONDANSETRON HCL 4 MG/2ML IJ SOLN: 4.0000 mg | Freq: Four times a day (QID) | INTRAMUSCULAR | Status: DC | PRN

## 2020-09-11 MED ORDER — MAGNESIUM CITRATE PO SOLN: 1.0000 | Freq: Once | ORAL | Status: DC | PRN

## 2020-09-11 MED ORDER — SCOPOLAMINE 1 MG/3DAYS TD PT72: 1.0000 | MEDICATED_PATCH | TRANSDERMAL | Status: DC

## 2020-09-11 MED ORDER — PHENYLEPHRINE 40 MCG/ML (10ML) SYRINGE FOR IV PUSH (FOR BLOOD PRESSURE SUPPORT)
PREFILLED_SYRINGE | INTRAVENOUS | Status: AC
  Filled 2020-09-11: qty 10

## 2020-09-11 MED ORDER — POTASSIUM CHLORIDE IN NACL 20-0.9 MEQ/L-% IV SOLN
INTRAVENOUS | Status: DC
Start: 2020-09-11 — End: 2020-09-17
  Filled 2020-09-11 (×4): qty 1000

## 2020-09-11 MED ORDER — CHLORHEXIDINE GLUCONATE 0.12 % MT SOLN
OROMUCOSAL | Status: AC
  Administered 2020-09-11: 15 mL via OROMUCOSAL
  Filled 2020-09-11: qty 15

## 2020-09-11 MED ORDER — LIDOCAINE 2% (20 MG/ML) 5 ML SYRINGE
INTRAMUSCULAR | Status: DC | PRN
  Administered 2020-09-11: 20 mg via INTRAVENOUS

## 2020-09-11 MED ORDER — VITAMIN D 25 MCG (1000 UNIT) PO TABS
2000.0000 [IU] | ORAL_TABLET | Freq: Two times a day (BID) | ORAL | Status: DC
  Administered 2020-09-11 – 2020-09-17 (×11): 2000 [IU] via ORAL
  Filled 2020-09-11 (×11): qty 2

## 2020-09-11 MED ORDER — ROCURONIUM BROMIDE 10 MG/ML (PF) SYRINGE
PREFILLED_SYRINGE | INTRAVENOUS | Status: DC | PRN
  Administered 2020-09-11: 60 mg via INTRAVENOUS

## 2020-09-11 MED ORDER — DEXAMETHASONE SODIUM PHOSPHATE 10 MG/ML IJ SOLN
INTRAMUSCULAR | Status: DC | PRN
  Administered 2020-09-11: 5 mg via INTRAVENOUS

## 2020-09-11 MED ORDER — SCOPOLAMINE 1 MG/3DAYS TD PT72
MEDICATED_PATCH | TRANSDERMAL | Status: AC
  Administered 2020-09-11: 1.5 mg via TRANSDERMAL
  Filled 2020-09-11: qty 1

## 2020-09-11 MED ORDER — ONDANSETRON HCL 4 MG/2ML IJ SOLN
INTRAMUSCULAR | Status: AC
  Filled 2020-09-11: qty 2

## 2020-09-11 MED ORDER — ONDANSETRON HCL 4 MG PO TABS: 4.0000 mg | ORAL_TABLET | Freq: Four times a day (QID) | ORAL | Status: DC | PRN

## 2020-09-11 MED ORDER — ACETAMINOPHEN 325 MG PO TABS
650.0000 mg | ORAL_TABLET | Freq: Two times a day (BID) | ORAL | Status: DC
  Administered 2020-09-11 – 2020-09-17 (×8): 650 mg via ORAL
  Filled 2020-09-11 (×9): qty 2

## 2020-09-11 MED ORDER — HYDROCODONE-ACETAMINOPHEN 7.5-325 MG PO TABS
1.0000 | ORAL_TABLET | Freq: Four times a day (QID) | ORAL | Status: DC | PRN
  Administered 2020-09-11 – 2020-09-17 (×18): 2 via ORAL
  Filled 2020-09-11 (×10): qty 2
  Filled 2020-09-11: qty 1
  Filled 2020-09-11 (×6): qty 2
  Filled 2020-09-11: qty 1
  Filled 2020-09-11: qty 2

## 2020-09-11 MED ORDER — DOCUSATE SODIUM 100 MG PO CAPS
100.0000 mg | ORAL_CAPSULE | Freq: Two times a day (BID) | ORAL | Status: DC
Start: 2020-09-11 — End: 2020-09-17
  Administered 2020-09-11 – 2020-09-17 (×11): 100 mg via ORAL
  Filled 2020-09-11 (×11): qty 1

## 2020-09-11 MED ORDER — MIDAZOLAM HCL 2 MG/2ML IJ SOLN
INTRAMUSCULAR | Status: AC
  Filled 2020-09-11: qty 2

## 2020-09-11 MED ORDER — 0.9 % SODIUM CHLORIDE (POUR BTL) OPTIME
TOPICAL | Status: DC | PRN
  Administered 2020-09-11: 250 mL

## 2020-09-11 MED ORDER — ONDANSETRON HCL 4 MG/2ML IJ SOLN
INTRAMUSCULAR | Status: DC | PRN
  Administered 2020-09-11: 4 mg via INTRAVENOUS

## 2020-09-11 MED ORDER — BISACODYL 5 MG PO TBEC: 5.0000 mg | DELAYED_RELEASE_TABLET | Freq: Every day | ORAL | Status: DC | PRN

## 2020-09-11 MED ORDER — PROPOFOL 10 MG/ML IV BOLUS
INTRAVENOUS | Status: AC
  Filled 2020-09-11: qty 20

## 2020-09-11 MED ORDER — CHLORHEXIDINE GLUCONATE 0.12 % MT SOLN: 15.0000 mL | Freq: Once | OROMUCOSAL | Status: AC

## 2020-09-11 MED ORDER — CHLORHEXIDINE GLUCONATE CLOTH 2 % EX PADS
6.0000 | MEDICATED_PAD | Freq: Every day | CUTANEOUS | Status: DC
  Administered 2020-09-11 – 2020-09-17 (×6): 6 via TOPICAL

## 2020-09-11 MED ORDER — MEPERIDINE HCL 25 MG/ML IJ SOLN: 6.2500 mg | INTRAMUSCULAR | Status: DC | PRN

## 2020-09-11 MED ORDER — ACETAMINOPHEN 325 MG PO TABS
325.0000 mg | ORAL_TABLET | Freq: Four times a day (QID) | ORAL | Status: DC | PRN
  Administered 2020-09-16 (×2): 650 mg via ORAL
  Filled 2020-09-11: qty 2

## 2020-09-11 MED ORDER — EPHEDRINE 5 MG/ML INJ
INTRAVENOUS | Status: AC
  Filled 2020-09-11: qty 10

## 2020-09-11 MED ORDER — PANTOPRAZOLE SODIUM 40 MG PO TBEC
40.0000 mg | DELAYED_RELEASE_TABLET | Freq: Every day | ORAL | Status: DC
Start: 2020-09-11 — End: 2020-09-17
  Administered 2020-09-11 – 2020-09-17 (×5): 40 mg via ORAL
  Filled 2020-09-11 (×5): qty 1

## 2020-09-11 MED ORDER — SENNOSIDES-DOCUSATE SODIUM 8.6-50 MG PO TABS: 1.0000 | ORAL_TABLET | Freq: Every evening | ORAL | Status: DC | PRN

## 2020-09-11 MED ORDER — PROPOFOL 10 MG/ML IV BOLUS
INTRAVENOUS | Status: DC | PRN
  Administered 2020-09-11: 120 mg via INTRAVENOUS

## 2020-09-11 MED ORDER — CHLORHEXIDINE GLUCONATE CLOTH 2 % EX PADS
6.0000 | MEDICATED_PAD | Freq: Every day | CUTANEOUS | Status: DC
  Administered 2020-09-11: 6 via TOPICAL

## 2020-09-11 MED ORDER — ACETAMINOPHEN 500 MG PO TABS
ORAL_TABLET | ORAL | Status: AC
  Administered 2020-09-11: 500 mg via ORAL
  Filled 2020-09-11: qty 2

## 2020-09-11 MED ORDER — MUPIROCIN 2 % EX OINT
1.0000 "application " | TOPICAL_OINTMENT | Freq: Two times a day (BID) | CUTANEOUS | Status: DC
  Filled 2020-09-11: qty 22

## 2020-09-11 MED ORDER — LIDOCAINE 2% (20 MG/ML) 5 ML SYRINGE
INTRAMUSCULAR | Status: AC
  Filled 2020-09-11: qty 5

## 2020-09-11 MED ORDER — SUGAMMADEX SODIUM 200 MG/2ML IV SOLN
INTRAVENOUS | Status: DC | PRN
  Administered 2020-09-11 (×2): 100 mg via INTRAVENOUS

## 2020-09-11 MED ORDER — CEFAZOLIN SODIUM-DEXTROSE 1-4 GM/50ML-% IV SOLN
1.0000 g | Freq: Four times a day (QID) | INTRAVENOUS | Status: AC
  Administered 2020-09-11 – 2020-09-12 (×3): 1 g via INTRAVENOUS
  Filled 2020-09-11 (×3): qty 50

## 2020-09-11 MED ORDER — HYDROMORPHONE HCL 1 MG/ML IJ SOLN
INTRAMUSCULAR | Status: AC
  Administered 2020-09-11: 0.5 mg via INTRAVENOUS
  Filled 2020-09-11: qty 1

## 2020-09-11 MED ORDER — HYDROCODONE-ACETAMINOPHEN 5-325 MG PO TABS
1.0000 | ORAL_TABLET | Freq: Four times a day (QID) | ORAL | Status: DC | PRN
  Filled 2020-09-11: qty 1

## 2020-09-11 MED ORDER — DEXAMETHASONE SODIUM PHOSPHATE 10 MG/ML IJ SOLN
INTRAMUSCULAR | Status: AC
  Filled 2020-09-11: qty 1

## 2020-09-11 MED ORDER — METHOCARBAMOL 1000 MG/10ML IJ SOLN
500.0000 mg | Freq: Three times a day (TID) | INTRAMUSCULAR | Status: DC
  Administered 2020-09-11: 500 mg via INTRAVENOUS
  Filled 2020-09-11: qty 500
  Filled 2020-09-11: qty 5

## 2020-09-11 MED ORDER — KETOROLAC TROMETHAMINE 15 MG/ML IJ SOLN
7.5000 mg | Freq: Four times a day (QID) | INTRAMUSCULAR | Status: AC
  Administered 2020-09-11 – 2020-09-12 (×4): 7.5 mg via INTRAVENOUS
  Filled 2020-09-11 (×4): qty 1

## 2020-09-11 MED ORDER — METHOCARBAMOL 500 MG PO TABS
500.0000 mg | ORAL_TABLET | Freq: Three times a day (TID) | ORAL | Status: DC
  Administered 2020-09-11 – 2020-09-16 (×13): 500 mg via ORAL
  Filled 2020-09-11 (×13): qty 1

## 2020-09-11 MED ORDER — MORPHINE SULFATE (PF) 2 MG/ML IV SOLN
0.5000 mg | INTRAVENOUS | Status: DC | PRN
  Administered 2020-09-13: 0.5 mg via INTRAVENOUS
  Administered 2020-09-15 – 2020-09-16 (×5): 1 mg via INTRAVENOUS
  Filled 2020-09-11 (×6): qty 1

## 2020-09-11 MED ORDER — CEFAZOLIN SODIUM-DEXTROSE 2-4 GM/100ML-% IV SOLN
2.0000 g | Freq: Once | INTRAVENOUS | Status: AC
  Administered 2020-09-11: 2 g via INTRAVENOUS

## 2020-09-11 MED ORDER — OXYCODONE HCL 5 MG/5ML PO SOLN: 5.0000 mg | Freq: Once | ORAL | Status: DC | PRN

## 2020-09-11 MED ORDER — EPHEDRINE SULFATE-NACL 50-0.9 MG/10ML-% IV SOSY
PREFILLED_SYRINGE | INTRAVENOUS | Status: DC | PRN
  Administered 2020-09-11 (×2): 10 mg via INTRAVENOUS

## 2020-09-11 MED ORDER — METOCLOPRAMIDE HCL 5 MG PO TABS: 5.0000 mg | ORAL_TABLET | Freq: Three times a day (TID) | ORAL | Status: DC | PRN

## 2020-09-11 MED ORDER — ENOXAPARIN SODIUM 40 MG/0.4ML ~~LOC~~ SOLN
40.0000 mg | SUBCUTANEOUS | Status: DC
  Administered 2020-09-12: 40 mg via SUBCUTANEOUS
  Filled 2020-09-11 (×2): qty 0.4

## 2020-09-11 MED ORDER — ROCURONIUM BROMIDE 10 MG/ML (PF) SYRINGE
PREFILLED_SYRINGE | INTRAVENOUS | Status: AC
  Filled 2020-09-11: qty 10

## 2020-09-11 MED ORDER — METOCLOPRAMIDE HCL 5 MG/ML IJ SOLN: 5.0000 mg | Freq: Three times a day (TID) | INTRAMUSCULAR | Status: DC | PRN

## 2020-09-11 MED ORDER — PROMETHAZINE HCL 25 MG/ML IJ SOLN: 6.2500 mg | INTRAMUSCULAR | Status: DC | PRN

## 2020-09-11 MED ORDER — FENTANYL CITRATE (PF) 250 MCG/5ML IJ SOLN
INTRAMUSCULAR | Status: DC | PRN
  Administered 2020-09-11: 200 ug via INTRAVENOUS

## 2020-09-11 MED ORDER — FENTANYL CITRATE (PF) 250 MCG/5ML IJ SOLN
INTRAMUSCULAR | Status: AC
  Filled 2020-09-11: qty 5

## 2020-09-11 MED ORDER — CEFAZOLIN SODIUM-DEXTROSE 2-4 GM/100ML-% IV SOLN
INTRAVENOUS | Status: AC
  Filled 2020-09-11: qty 100

## 2020-09-11 MED ORDER — PHENYLEPHRINE 40 MCG/ML (10ML) SYRINGE FOR IV PUSH (FOR BLOOD PRESSURE SUPPORT)
PREFILLED_SYRINGE | INTRAVENOUS | Status: DC | PRN
  Administered 2020-09-11: 80 ug via INTRAVENOUS

## 2020-09-11 MED ORDER — MIDAZOLAM HCL 2 MG/2ML IJ SOLN: 0.5000 mg | Freq: Once | INTRAMUSCULAR | Status: DC | PRN

## 2020-09-11 MED ORDER — LACTATED RINGERS IV SOLN: INTRAVENOUS | Status: DC

## 2020-09-11 MED ORDER — MIDAZOLAM HCL 5 MG/5ML IJ SOLN
INTRAMUSCULAR | Status: DC | PRN
  Administered 2020-09-11: 2 mg via INTRAVENOUS

## 2020-09-11 MED ORDER — OXYCODONE HCL 5 MG PO TABS: 5.0000 mg | ORAL_TABLET | Freq: Once | ORAL | Status: DC | PRN

## 2020-09-11 MED ORDER — ASCORBIC ACID 500 MG PO TABS
500.0000 mg | ORAL_TABLET | Freq: Every day | ORAL | Status: DC
  Administered 2020-09-11 – 2020-09-17 (×5): 500 mg via ORAL
  Filled 2020-09-11 (×5): qty 1

## 2020-09-11 MED ORDER — HYDROMORPHONE HCL 1 MG/ML IJ SOLN
0.2500 mg | INTRAMUSCULAR | Status: DC | PRN
  Administered 2020-09-11: 0.5 mg via INTRAVENOUS

## 2020-09-11 MED ORDER — ACETAMINOPHEN 500 MG PO TABS: 1000.0000 mg | ORAL_TABLET | Freq: Once | ORAL | Status: AC

## 2020-09-11 SURGICAL SUPPLY — 53 items
BANDAGE ESMARK 6X9 LF (GAUZE/BANDAGES/DRESSINGS) ×1 IMPLANT
BAR EXFX 500X11 NS LF (EXFIX) ×2
BAR GLASS FIBER EXFX 11X500 (EXFIX) ×2 IMPLANT
BNDG CMPR 9X6 STRL LF SNTH (GAUZE/BANDAGES/DRESSINGS)
BNDG COHESIVE 6X5 TAN STRL LF (GAUZE/BANDAGES/DRESSINGS) IMPLANT
BNDG ELASTIC 4X5.8 VLCR STR LF (GAUZE/BANDAGES/DRESSINGS) ×3 IMPLANT
BNDG ELASTIC 6X5.8 VLCR STR LF (GAUZE/BANDAGES/DRESSINGS) ×1 IMPLANT
BNDG ESMARK 6X9 LF (GAUZE/BANDAGES/DRESSINGS)
BNDG GAUZE ELAST 4 BULKY (GAUZE/BANDAGES/DRESSINGS) ×3 IMPLANT
BRUSH SCRUB EZ PLAIN DRY (MISCELLANEOUS) ×4 IMPLANT
COVER SURGICAL LIGHT HANDLE (MISCELLANEOUS) ×3 IMPLANT
COVER WAND RF STERILE (DRAPES) ×1 IMPLANT
CUFF TOURN SGL QUICK 18X4 (TOURNIQUET CUFF) IMPLANT
DRAPE C-ARM 42X72 X-RAY (DRAPES) ×1 IMPLANT
DRAPE C-ARMOR (DRAPES) ×2 IMPLANT
DRAPE INCISE IOBAN 66X45 STRL (DRAPES) ×1 IMPLANT
DRAPE U-SHAPE 47X51 STRL (DRAPES) ×3 IMPLANT
DRSG ADAPTIC 3X8 NADH LF (GAUZE/BANDAGES/DRESSINGS) ×1 IMPLANT
DRSG MEPITEL 4X7.2 (GAUZE/BANDAGES/DRESSINGS) IMPLANT
ELECT REM PT RETURN 9FT ADLT (ELECTROSURGICAL) ×2
ELECTRODE REM PT RTRN 9FT ADLT (ELECTROSURGICAL) ×1 IMPLANT
EVACUATOR 1/8 PVC DRAIN (DRAIN) IMPLANT
GAUZE SPONGE 4X4 12PLY STRL (GAUZE/BANDAGES/DRESSINGS) ×2 IMPLANT
GLOVE BIO SURGEON STRL SZ7.5 (GLOVE) ×2 IMPLANT
GLOVE BIO SURGEON STRL SZ8 (GLOVE) ×2 IMPLANT
GLOVE BIOGEL PI IND STRL 7.5 (GLOVE) ×1 IMPLANT
GLOVE BIOGEL PI INDICATOR 7.5 (GLOVE) ×1
GLOVE SRG 8 PF TXTR STRL LF DI (GLOVE) ×1 IMPLANT
GLOVE SURG UNDER POLY LF SZ8 (GLOVE) ×2
GOWN STRL REUS W/ TWL LRG LVL3 (GOWN DISPOSABLE) ×2 IMPLANT
GOWN STRL REUS W/ TWL XL LVL3 (GOWN DISPOSABLE) ×1 IMPLANT
GOWN STRL REUS W/TWL LRG LVL3 (GOWN DISPOSABLE) ×2
GOWN STRL REUS W/TWL XL LVL3 (GOWN DISPOSABLE) ×4
HALF PIN 5.0X160 (EXFIX) ×2 IMPLANT
KIT BASIN OR (CUSTOM PROCEDURE TRAY) ×2 IMPLANT
KIT TURNOVER KIT B (KITS) ×2 IMPLANT
MANIFOLD NEPTUNE II (INSTRUMENTS) ×2 IMPLANT
NEEDLE 22X1 1/2 (OR ONLY) (NEEDLE) IMPLANT
NS IRRIG 1000ML POUR BTL (IV SOLUTION) ×2 IMPLANT
PACK ORTHO EXTREMITY (CUSTOM PROCEDURE TRAY) ×2 IMPLANT
PAD ARMBOARD 7.5X6 YLW CONV (MISCELLANEOUS) ×3 IMPLANT
PAD CAST 4YDX4 CTTN HI CHSV (CAST SUPPLIES) IMPLANT
PADDING CAST COTTON 4X4 STRL (CAST SUPPLIES) ×2
PADDING CAST COTTON 6X4 STRL (CAST SUPPLIES) ×2 IMPLANT
PIN CLAMP 2BAR 75MM BLUE (EXFIX) ×2 IMPLANT
PIN HALF YELLOW 5X160X35 (EXFIX) ×2 IMPLANT
SPONGE LAP 18X18 RF (DISPOSABLE) ×1 IMPLANT
STAPLER VISISTAT 35W (STAPLE) ×1 IMPLANT
STOCKINETTE IMPERVIOUS LG (DRAPES) IMPLANT
STRIP CLOSURE SKIN 1/2X4 (GAUZE/BANDAGES/DRESSINGS) IMPLANT
TOWEL GREEN STERILE (TOWEL DISPOSABLE) ×4 IMPLANT
TOWEL GREEN STERILE FF (TOWEL DISPOSABLE) ×3 IMPLANT
UNDERPAD 30X36 HEAVY ABSORB (UNDERPADS AND DIAPERS) ×2 IMPLANT

## 2020-09-11 NOTE — Anesthesia Postprocedure Evaluation (Signed)
Anesthesia Post Note  Patient: Patty [name]  Procedure(s) Performed: EXTERNAL FIXATION, KNEE (Left Knee)     Patient location during evaluation: PACU Anesthesia Type: General Level of consciousness: sedated, patient cooperative and oriented Pain management: pain level controlled Vital Signs Assessment: post-procedure vital signs reviewed and stable Respiratory status: spontaneous breathing, nonlabored ventilation and respiratory function stable Cardiovascular status: blood pressure returned to baseline and stable Postop Assessment: no apparent nausea or vomiting Anesthetic complications: no   No complications documented.  Last Vitals:  Vitals:   09/11/20 1150 09/11/20 1159  BP: 112/60 108/64  Pulse: 60 63  Resp: 14 15  Temp:  36.6 C  SpO2: 96% 96%    Last Pain:  Vitals:   09/11/20 1159  TempSrc: Oral  PainSc: Asleep                 JACKSON,E. CARSWELL

## 2020-09-11 NOTE — Anesthesia Procedure Notes (Signed)
Procedure Name: Intubation Date/Time: 09/11/2020 9:56 AM Performed by: Trinna Post., CRNA Pre-anesthesia Checklist: Patient identified, Emergency Drugs available, Suction available, Patient being monitored and Timeout performed Patient Re-evaluated:Patient Re-evaluated prior to induction Oxygen Delivery Method: Circle system utilized Preoxygenation: Pre-oxygenation with 100% oxygen Induction Type: IV induction Ventilation: Mask ventilation without difficulty Laryngoscope Size: Mac and 3 Grade View: Grade I Tube type: Oral Tube size: 7.0 mm Number of attempts: 1 Airway Equipment and Method: Stylet Placement Confirmation: ETT inserted through vocal cords under direct vision,  positive ETCO2 and breath sounds checked- equal and bilateral Secured at: 22 cm Tube secured with: Tape Dental Injury: Teeth and Oropharynx as per pre-operative assessment

## 2020-09-11 NOTE — Plan of Care (Signed)
  Problem: Activity: Goal: Risk for activity intolerance will decrease Outcome: Progressing   Problem: Coping: Goal: Level of anxiety will decrease Outcome: Progressing   Problem: Pain Managment: Goal: General experience of comfort will improve Outcome: Progressing   Problem: Safety: Goal: Ability to remain free from injury will improve Outcome: Progressing   Problem: Skin Integrity: Goal: Risk for impaired skin integrity will decrease Outcome: Progressing   

## 2020-09-11 NOTE — Anesthesia Preprocedure Evaluation (Addendum)
Anesthesia Evaluation  Patient identified by MRN, date of birth, ID band Patient awake    Reviewed: Allergy & Precautions, NPO status , Patient's Chart, lab work & pertinent test results  History of Anesthesia Complications Negative for: history of anesthetic complications  Airway Mallampati: I  TM Distance: >3 FB Neck ROM: Full    Dental  (+) Caps, Dental Advisory Given   Pulmonary neg pulmonary ROS,  09/10/2020 SARS coronavirus NEG   breath sounds clear to auscultation       Cardiovascular negative cardio ROS   Rhythm:Regular Rate:Normal     Neuro/Psych negative neurological ROS     GI/Hepatic negative GI ROS, Neg liver ROS,   Endo/Other  negative endocrine ROS  Renal/GU negative Renal ROS     Musculoskeletal   Abdominal   Peds  Hematology negative hematology ROS (+)   Anesthesia Other Findings   Reproductive/Obstetrics                            Anesthesia Physical Anesthesia Plan  ASA: I  Anesthesia Plan: General   Post-op Pain Management:    Induction: Intravenous  PONV Risk Score and Plan: 3 and Ondansetron, Dexamethasone and Scopolamine patch - Pre-op  Airway Management Planned: Oral ETT  Additional Equipment: None  Intra-op Plan:   Post-operative Plan: Extubation in OR  Informed Consent: I have reviewed the patients History and Physical, chart, labs and discussed the procedure including the risks, benefits and alternatives for the proposed anesthesia with the patient or authorized representative who has indicated his/her understanding and acceptance.     Dental advisory given  Plan Discussed with: CRNA and Surgeon  Anesthesia Plan Comments:        Anesthesia Quick Evaluation
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
## 2020-09-11 NOTE — Transfer of Care (Signed)
Immediate Anesthesia Transfer of Care Note  Patient: AHRIYAH VANNEST  Procedure(s) Performed: EXTERNAL FIXATION, KNEE (Left Knee)  Patient Location: PACU  Anesthesia Type:General  Level of Consciousness: awake and drowsy  Airway & Oxygen Therapy: Patient Spontanous Breathing and Patient connected to nasal cannula oxygen  Post-op Assessment: Report given to RN and Post -op Vital signs reviewed and stable  Post vital signs: Reviewed and stable  Last Vitals:  Vitals Value Taken Time  BP 129/59 09/11/20 1115  Temp 36.1 C 09/11/20 1115  Pulse 61 09/11/20 1117  Resp 13 09/11/20 1117  SpO2 97 % 09/11/20 1117  Vitals shown include unvalidated device data.  Last Pain:  Vitals:   09/11/20 1115  TempSrc:   PainSc: 5          Complications: No complications documented.
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
## 2020-09-12 ENCOUNTER — Encounter (HOSPITAL_COMMUNITY): Payer: Self-pay | Admitting: Orthopedic Surgery

## 2020-09-12 DIAGNOSIS — E559 Vitamin D deficiency, unspecified: Secondary | ICD-10-CM | POA: Diagnosis present

## 2020-09-12 LAB — CBC
HCT: 31.3 % — ABNORMAL LOW (ref 36.0–46.0)
Hemoglobin: 11.2 g/dL — ABNORMAL LOW (ref 12.0–15.0)
MCH: 33.1 pg (ref 26.0–34.0)
MCHC: 35.8 g/dL (ref 30.0–36.0)
MCV: 92.6 fL (ref 80.0–100.0)
Platelets: 124 10*3/uL — ABNORMAL LOW (ref 150–400)
RBC: 3.38 MIL/uL — ABNORMAL LOW (ref 3.87–5.11)
RDW: 12.5 % (ref 11.5–15.5)
WBC: 7.3 10*3/uL (ref 4.0–10.5)
nRBC: 0 % (ref 0.0–0.2)

## 2020-09-12 LAB — COMPREHENSIVE METABOLIC PANEL
ALT: 16 U/L (ref 0–44)
AST: 18 U/L (ref 15–41)
Albumin: 3 g/dL — ABNORMAL LOW (ref 3.5–5.0)
Alkaline Phosphatase: 42 U/L (ref 38–126)
Anion gap: 8 (ref 5–15)
BUN: 7 mg/dL — ABNORMAL LOW (ref 8–23)
CO2: 26 mmol/L (ref 22–32)
Calcium: 8.8 mg/dL — ABNORMAL LOW (ref 8.9–10.3)
Chloride: 101 mmol/L (ref 98–111)
Creatinine, Ser: 0.62 mg/dL (ref 0.44–1.00)
GFR, Estimated: >60 mL/min (ref >60)
Glucose, Bld: 120 mg/dL — ABNORMAL HIGH (ref 70–99)
Potassium: 4.7 mmol/L (ref 3.5–5.1)
Sodium: 135 mmol/L (ref 135–145)
Total Bilirubin: 1.2 mg/dL (ref 0.3–1.2)
Total Protein: 5.2 g/dL — ABNORMAL LOW (ref 6.5–8.1)

## 2020-09-12 LAB — VITAMIN D 25 HYDROXY (VIT D DEFICIENCY, FRACTURES): Vit D, 25-Hydroxy: 26.01 ng/mL — ABNORMAL LOW (ref 30–100)

## 2020-09-12 MED ORDER — CEFAZOLIN SODIUM-DEXTROSE 2-4 GM/100ML-% IV SOLN: 2.0000 g | INTRAVENOUS | Status: AC
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
## 2020-09-12 NOTE — Plan of Care (Signed)
  Problem: Activity: Goal: Risk for activity intolerance will decrease Outcome: Progressing   Problem: Coping: Goal: Level of anxiety will decrease Outcome: Progressing   Problem: Pain Managment: Goal: General experience of comfort will improve Outcome: Progressing   Problem: Safety: Goal: Ability to remain free from injury will improve Outcome: Progressing   Problem: Skin Integrity: Goal: Risk for impaired skin integrity will decrease Outcome: Progressing   

## 2020-09-12 NOTE — Progress Notes (Signed)
Orthopaedic Trauma Service Progress Note  Patient ID: Patty [name] MRN: [id number] DOB/AGE: 1955/08/03 65 y.o.  Subjective:  Doing great Pain controlled No new issues   Tolerating diet  Foley in    ROS As above  Objective:   VITALS:   Vitals:   09/11/20 1500 09/11/20 2133 09/12/20 0600 09/12/20 0753  BP: (!) 111/59 (!) 107/53 (!) 98/44 (!) 105/42  Pulse: (!) 56 (!) 58 (!) 56 (!) 59  Resp: 17 16 16 19   Temp: 98.5 F (36.9 C) 98.6 F (37 C) 98.2 F (36.8 C) 99.3 F (37.4 C)  TempSrc: Oral Oral Oral Oral  SpO2:  98% 97% 99%  Weight:      Height:        Estimated body mass index is 17.65 kg/m as calculated from the following:   Height as of this encounter: 5\' 9"  (1.753 m).   Weight as of this encounter: 54.2 kg.   Intake/Output      03/06 0701 03/07 0700 03/07 0701 03/08 0700   P.O. 0 240   I.V. (mL/kg) 1325 (24.4)    IV Piggyback 250    Total Intake(mL/kg) 1575 (29.1) 240 (4.4)   Urine (mL/kg/hr) 1975 (1.5) 1200 (4.1)   Blood 15    Total Output 1990 1200   Net -415 -960        Urine Occurrence 1 x      LABS  Results for orders placed or performed during the hospital encounter of 09/10/20 (from the past 24 hour(s))  VITAMIN D 25 Hydroxy (Vit-D Deficiency, Fractures)     Status: Abnormal   Collection Time: 09/12/20  3:44 AM  Result Value Ref Range   Vit D, 25-Hydroxy 26.01 (L) 30 - 100 ng/mL  Comprehensive metabolic panel     Status: Abnormal   Collection Time: 09/12/20  3:44 AM  Result Value Ref Range   Sodium 135 135 - 145 mmol/L   Potassium 4.7 3.5 - 5.1 mmol/L   Chloride 101 98 - 111 mmol/L   CO2 26 22 - 32 mmol/L   Glucose, Bld 120 (H) 70 - 99 mg/dL   BUN 7 (L) 8 - 23 mg/dL   Creatinine, Ser 11/12/20 0.44 - 1.00 mg/dL   Calcium 8.8 (L) 8.9 - 10.3 mg/dL   Total Protein 5.2 (L) 6.5 - 8.1 g/dL   Albumin 3.0 (L) 3.5 - 5.0 g/dL   AST 18 15 - 41 U/L   ALT 16 0 - 44 U/L    Alkaline Phosphatase 42 38 - 126 U/L   Total Bilirubin 1.2 0.3 - 1.2 mg/dL   GFR, Estimated 11/12/20 9.32 mL/min   Anion gap 8 5 - 15  CBC     Status: Abnormal   Collection Time: 09/12/20  3:44 AM  Result Value Ref Range   WBC 7.3 4.0 - 10.5 K/uL   RBC 3.38 (L) 3.87 - 5.11 MIL/uL   Hemoglobin 11.2 (L) 12.0 - 15.0 g/dL   HCT >12 (L) 11/12/20 - 45.8 %   MCV 92.6 80.0 - 100.0 fL   MCH 33.1 26.0 - 34.0 pg   MCHC 35.8 30.0 - 36.0 g/dL   RDW 09.9 83.3 - 82.5 %   Platelets 124 (L) 150 - 400 K/uL   nRBC 0.0 0.0 - 0.2 %  PHYSICAL EXAM:   Gen: sitting up in chair, NAD, appears well  Lungs: clear  Cardiac: RRR Ext:       Left Lower Extremity   Ex fix stable  pinsites look good  Ext warm   Swelling stable  Distal motor and sensory functions intact  Good active ankle extension   No pain out of proportion with passive stretch   + DP pulse   No DCT  Assessment/Plan: 1 Day Post-Op   Principal Problem:   Tibial plateau fracture, left, closed, initial encounter Active Problems:   Vitamin D insufficiency   Anti-infectives (From admission, onward)   Start     Dose/Rate Route Frequency Ordered Stop   09/11/20 1600  ceFAZolin (ANCEF) IVPB 1 g/50 mL premix        1 g 100 mL/hr over 30 Minutes Intravenous Every 6 hours 09/11/20 1159 09/12/20 0439   09/11/20 0915  ceFAZolin (ANCEF) IVPB 2g/100 mL premix        2 g 200 mL/hr over 30 Minutes Intravenous  Once 09/11/20 0911 09/11/20 1030   09/11/20 0910  ceFAZolin (ANCEF) 2-4 GM/100ML-% IVPB       Note to Pharmacy: Kathrene Bongo   : cabinet override      09/11/20 0910 09/11/20 1009    .  POD/HD#: 1  65 y/o female with L bicondylar tibial plateau fracture  -L bicondylar tibial plateau fracture s/p Ex fix   OR tomorrow for ORIF  NWB L leg   Ice and elevate    Foot above knee and knee above heart   Ok to mobilize with therapies   - Pain management:  Multimodal   Titrate accordingly   - ABL anemia/Hemodynamics  Monitor    - Medical issues   Vitamin d insufficiency    Supplement  - DVT/PE prophylaxis:  Lovenox for now  Convert to DOAC post op   - ID:   periop abx  - Metabolic Bone Disease:  As above   Recommend DEXA in 4-8 weeks   - Activity:  NWB L leg   - FEN/GI prophylaxis/Foley/Lines:  Reg diet  NPO after MN   Foley for now for bladder rest   Dc after next surgery   -Ex-fix/Splint care:  Ok to manipulate L leg by ex fix    - Impediments to fracture healing:  Vitamin d insufficiency   - Dispo:  OR tomorrow for ORIF L tibial plateau   Aggressive ice and elevation of L leg     [name], PA-C [phone] (C) 09/12/2020, 12:24 PM  [facility] [address] [place] [address] [phone] [phone] (F)    After 5pm and on the weekends please log on to Amion, go to orthopaedics and the look under the [facility] Call for the provider(s) on call. You can also call our office at [phone] and then follow the prompts to be connected to the call team.
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
## 2020-09-12 NOTE — Plan of Care (Signed)
  Problem: Activity: Goal: Risk for activity intolerance will decrease Outcome: Progressing   Problem: Nutrition: Goal: Adequate nutrition will be maintained Outcome: Progressing   Problem: Coping: Goal: Level of anxiety will decrease Outcome: Progressing   Problem: Pain Managment: Goal: General experience of comfort will improve Outcome: Progressing   Problem: Safety: Goal: Ability to remain free from injury will improve Outcome: Progressing   

## 2020-09-13 LAB — CBC
HCT: 32.3 % — ABNORMAL LOW (ref 36.0–46.0)
Hemoglobin: 10.6 g/dL — ABNORMAL LOW (ref 12.0–15.0)
MCH: 31.5 pg (ref 26.0–34.0)
MCHC: 32.8 g/dL (ref 30.0–36.0)
MCV: 95.8 fL (ref 80.0–100.0)
Platelets: 123 10*3/uL — ABNORMAL LOW (ref 150–400)
RBC: 3.37 MIL/uL — ABNORMAL LOW (ref 3.87–5.11)
RDW: 12.7 % (ref 11.5–15.5)
WBC: 5.1 10*3/uL (ref 4.0–10.5)
nRBC: 0 % (ref 0.0–0.2)

## 2020-09-13 MED ORDER — ENOXAPARIN SODIUM 40 MG/0.4ML ~~LOC~~ SOLN
40.0000 mg | SUBCUTANEOUS | Status: DC
  Administered 2020-09-13 – 2020-09-14 (×2): 40 mg via SUBCUTANEOUS
  Filled 2020-09-13 (×2): qty 0.4
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
## 2020-09-14 NOTE — Progress Notes (Addendum)
Patient ID: Patty [name], female   DOB: [birth year], 65 y.o.   MRN: [id number] I have reviewed this patient's chart extensively including a thorough review of the x-rays and CT scan with 3D reconstruction of the left knee.  The patient has quite a complex left tibial plateau fracture.  There is involvement of the tibial spine as well as a posterior-medial shear component.  There is significant impaction and comminution of the lateral tibial plateau as well as the medial and posterior aspect of the proximal tibia.  This is a very complicated fracture pattern and as a general orthopedic surgeon, I am not comfortable with surgical management of this complex fracture.  The patient would best be served by maintaining the care of her knee with a trauma fellowship trained orthopedic surgeon/traumatologist.  Complicated fractures such as this are best treated by orthopedic traumatologists who have more expertise in treating these types of fractures.  This particular patient's fracture of the left tibial plateau is so complicated, a consultation for further surgical management and definitive fixation of the fracture by an orthopedic trauma specialist is indicated and surgically necessary in order to have as optimal an outcome as possible.  Further consultation with Dr. Carola Frost and Dr. Jena Gauss who are both orthopedic fellowship trained traumatologist is absolutely necessary in this situation. Also, transferring this patient to another facility approximately an hour away would be an unnecessary and undue burden.
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
## 2020-09-14 NOTE — Clinical Note (Incomplete)
.  otsin

## 2020-09-14 NOTE — Plan of Care (Signed)

## 2020-09-14 NOTE — Plan of Care (Signed)
  Problem: Health Behavior/Discharge Planning: Goal: Ability to manage health-related needs will improve Outcome: Progressing   Problem: Clinical Measurements: Goal: Ability to maintain clinical measurements within normal limits will improve Outcome: Progressing Goal: Will remain free from infection Outcome: Progressing   

## 2020-09-15 ENCOUNTER — Inpatient Hospital Stay (HOSPITAL_COMMUNITY): Payer: BC Managed Care – PPO

## 2020-09-15 ENCOUNTER — Encounter (HOSPITAL_COMMUNITY)
Admission: EM | Disposition: A | Discharge status: Home Health | Payer: Self-pay | Source: Home / Self Care | Attending: Orthopedic Surgery

## 2020-09-15 ENCOUNTER — Encounter (HOSPITAL_COMMUNITY): Payer: Self-pay | Admitting: Orthopedic Surgery

## 2020-09-15 ENCOUNTER — Inpatient Hospital Stay (HOSPITAL_COMMUNITY): Payer: BC Managed Care – PPO | Admitting: Anesthesiology

## 2020-09-15 HISTORY — PX: ORIF TIBIA PLATEAU: SHX2132

## 2020-09-15 LAB — TYPE AND SCREEN
ABO/RH(D): A POS
Antibody Screen: NEGATIVE

## 2020-09-15 LAB — ABO/RH: ABO/RH(D): A POS

## 2020-09-15 SURGERY — OPEN REDUCTION INTERNAL FIXATION (ORIF) TIBIAL PLATEAU
Anesthesia: General | Laterality: Left

## 2020-09-15 MED ORDER — PROMETHAZINE HCL 25 MG/ML IJ SOLN
6.2500 mg | INTRAMUSCULAR | Status: DC | PRN
Start: 2020-09-15 — End: 2020-09-15
  Administered 2020-09-15: 12.5 mg via INTRAVENOUS

## 2020-09-15 MED ORDER — FENTANYL CITRATE (PF) 250 MCG/5ML IJ SOLN
INTRAMUSCULAR | Status: AC
  Filled 2020-09-15: qty 5

## 2020-09-15 MED ORDER — CEFAZOLIN SODIUM-DEXTROSE 2-4 GM/100ML-% IV SOLN
2.0000 g | Freq: Three times a day (TID) | INTRAVENOUS | Status: AC
  Administered 2020-09-15 – 2020-09-16 (×3): 2 g via INTRAVENOUS
  Filled 2020-09-15 (×3): qty 100

## 2020-09-15 MED ORDER — LACTATED RINGERS IV SOLN: INTRAVENOUS | Status: DC | PRN

## 2020-09-15 MED ORDER — FENTANYL CITRATE (PF) 100 MCG/2ML IJ SOLN
INTRAMUSCULAR | Status: DC | PRN
  Administered 2020-09-15 (×5): 50 ug via INTRAVENOUS

## 2020-09-15 MED ORDER — CEFAZOLIN SODIUM-DEXTROSE 2-3 GM-%(50ML) IV SOLR
INTRAVENOUS | Status: DC | PRN
  Administered 2020-09-15: 2 g via INTRAVENOUS

## 2020-09-15 MED ORDER — LIDOCAINE 2% (20 MG/ML) 5 ML SYRINGE
INTRAMUSCULAR | Status: AC
  Filled 2020-09-15: qty 5

## 2020-09-15 MED ORDER — MIDAZOLAM HCL 2 MG/2ML IJ SOLN
0.5000 mg | Freq: Once | INTRAMUSCULAR | Status: DC | PRN
Start: 2020-09-15 — End: 2020-09-15

## 2020-09-15 MED ORDER — RIVAROXABAN 10 MG PO TABS
10.0000 mg | ORAL_TABLET | Freq: Every day | ORAL | Status: DC
  Administered 2020-09-16 – 2020-09-17 (×2): 10 mg via ORAL
  Filled 2020-09-15 (×2): qty 1

## 2020-09-15 MED ORDER — DEXAMETHASONE SODIUM PHOSPHATE 10 MG/ML IJ SOLN
INTRAMUSCULAR | Status: DC | PRN
  Administered 2020-09-15: 5 mg via INTRAVENOUS

## 2020-09-15 MED ORDER — MIDAZOLAM HCL 5 MG/5ML IJ SOLN
INTRAMUSCULAR | Status: DC | PRN
  Administered 2020-09-15: 2 mg via INTRAVENOUS

## 2020-09-15 MED ORDER — 0.9 % SODIUM CHLORIDE (POUR BTL) OPTIME
TOPICAL | Status: DC | PRN
  Administered 2020-09-15: 1000 mL

## 2020-09-15 MED ORDER — MIDAZOLAM HCL 2 MG/2ML IJ SOLN
INTRAMUSCULAR | Status: AC
  Filled 2020-09-15: qty 2

## 2020-09-15 MED ORDER — OXYCODONE HCL 5 MG/5ML PO SOLN: 5.0000 mg | Freq: Once | ORAL | Status: DC | PRN

## 2020-09-15 MED ORDER — CEFAZOLIN SODIUM-DEXTROSE 2-4 GM/100ML-% IV SOLN
INTRAVENOUS | Status: AC
  Filled 2020-09-15: qty 100

## 2020-09-15 MED ORDER — PROPOFOL 10 MG/ML IV BOLUS
INTRAVENOUS | Status: AC
  Filled 2020-09-15: qty 20

## 2020-09-15 MED ORDER — MEPERIDINE HCL 25 MG/ML IJ SOLN: 6.2500 mg | INTRAMUSCULAR | Status: DC | PRN

## 2020-09-15 MED ORDER — ALBUMIN HUMAN 5 % IV SOLN: INTRAVENOUS | Status: DC | PRN

## 2020-09-15 MED ORDER — SCOPOLAMINE 1 MG/3DAYS TD PT72
MEDICATED_PATCH | TRANSDERMAL | Status: AC
  Filled 2020-09-15: qty 1

## 2020-09-15 MED ORDER — PROMETHAZINE HCL 25 MG/ML IJ SOLN
INTRAMUSCULAR | Status: AC
  Filled 2020-09-15: qty 1

## 2020-09-15 MED ORDER — PHENYLEPHRINE 40 MCG/ML (10ML) SYRINGE FOR IV PUSH (FOR BLOOD PRESSURE SUPPORT)
PREFILLED_SYRINGE | INTRAVENOUS | Status: AC
  Filled 2020-09-15: qty 10

## 2020-09-15 MED ORDER — HYDROMORPHONE HCL 1 MG/ML IJ SOLN
INTRAMUSCULAR | Status: AC
  Filled 2020-09-15: qty 0.5

## 2020-09-15 MED ORDER — ONDANSETRON HCL 4 MG/2ML IJ SOLN
INTRAMUSCULAR | Status: AC
  Filled 2020-09-15: qty 2

## 2020-09-15 MED ORDER — HYDROMORPHONE HCL 1 MG/ML IJ SOLN: 0.2500 mg | INTRAMUSCULAR | Status: DC | PRN

## 2020-09-15 MED ORDER — ONDANSETRON HCL 4 MG/2ML IJ SOLN
INTRAMUSCULAR | Status: DC | PRN
  Administered 2020-09-15 (×2): 4 mg via INTRAVENOUS

## 2020-09-15 MED ORDER — ROCURONIUM BROMIDE 10 MG/ML (PF) SYRINGE
PREFILLED_SYRINGE | INTRAVENOUS | Status: AC
  Filled 2020-09-15: qty 10

## 2020-09-15 MED ORDER — ROCURONIUM BROMIDE 100 MG/10ML IV SOLN
INTRAVENOUS | Status: DC | PRN
  Administered 2020-09-15: 10 mg via INTRAVENOUS
  Administered 2020-09-15: 60 mg via INTRAVENOUS
  Administered 2020-09-15: 20 mg via INTRAVENOUS

## 2020-09-15 MED ORDER — DEXAMETHASONE SODIUM PHOSPHATE 10 MG/ML IJ SOLN
INTRAMUSCULAR | Status: AC
  Filled 2020-09-15: qty 1

## 2020-09-15 MED ORDER — OXYCODONE HCL 5 MG PO TABS: 5.0000 mg | ORAL_TABLET | Freq: Once | ORAL | Status: DC | PRN

## 2020-09-15 MED ORDER — PHENYLEPHRINE 40 MCG/ML (10ML) SYRINGE FOR IV PUSH (FOR BLOOD PRESSURE SUPPORT)
PREFILLED_SYRINGE | INTRAVENOUS | Status: DC | PRN
  Administered 2020-09-15 (×2): 40 ug via INTRAVENOUS

## 2020-09-15 MED ORDER — PROPOFOL 10 MG/ML IV BOLUS
INTRAVENOUS | Status: DC | PRN
  Administered 2020-09-15: 80 mg via INTRAVENOUS

## 2020-09-15 MED ORDER — SUGAMMADEX SODIUM 200 MG/2ML IV SOLN
INTRAVENOUS | Status: DC | PRN
  Administered 2020-09-15: 100 mg via INTRAVENOUS

## 2020-09-15 MED ORDER — LIDOCAINE 2% (20 MG/ML) 5 ML SYRINGE
INTRAMUSCULAR | Status: DC | PRN
  Administered 2020-09-15: 40 mg via INTRAVENOUS

## 2020-09-15 MED ORDER — SCOPOLAMINE 1 MG/3DAYS TD PT72
MEDICATED_PATCH | TRANSDERMAL | Status: DC | PRN
  Administered 2020-09-15: 1 via TRANSDERMAL

## 2020-09-15 MED ORDER — HYDROMORPHONE HCL 1 MG/ML IJ SOLN
INTRAMUSCULAR | Status: DC | PRN
  Administered 2020-09-15: .5 mg via INTRAVENOUS

## 2020-09-15 SURGICAL SUPPLY — 105 items
BANDAGE ESMARK 6X9 LF (GAUZE/BANDAGES/DRESSINGS) ×1 IMPLANT
BIT DRILL 100X2.5XANTM LCK (BIT) IMPLANT
BIT DRILL 2.5X2.75 QC CALB (BIT) ×1 IMPLANT
BIT DRILL CAL (BIT) IMPLANT
BIT DRL 100X2.5XANTM LCK (BIT) ×1
BLADE CLIPPER SURG (BLADE) IMPLANT
BLADE SURG 10 STRL SS (BLADE) ×2 IMPLANT
BLADE SURG 15 STRL LF DISP TIS (BLADE) ×1 IMPLANT
BLADE SURG 15 STRL SS (BLADE) ×2
BNDG CMPR 9X6 STRL LF SNTH (GAUZE/BANDAGES/DRESSINGS) ×1
BNDG COHESIVE 4X5 TAN STRL (GAUZE/BANDAGES/DRESSINGS) ×2 IMPLANT
BNDG ELASTIC 4X5.8 VLCR STR LF (GAUZE/BANDAGES/DRESSINGS) ×2 IMPLANT
BNDG ELASTIC 6X5.8 VLCR STR LF (GAUZE/BANDAGES/DRESSINGS) ×2 IMPLANT
BNDG ESMARK 6X9 LF (GAUZE/BANDAGES/DRESSINGS) ×2
BNDG GAUZE ELAST 4 BULKY (GAUZE/BANDAGES/DRESSINGS) ×2 IMPLANT
BONE CANC CHIPS 20CC PCAN1/4 (Bone Implant) ×2 IMPLANT
BONE CANC CHIPS 40CC CAN1/2 (Bone Implant) ×2 IMPLANT
BRUSH SCRUB EZ PLAIN DRY (MISCELLANEOUS) ×4 IMPLANT
CANISTER SUCT 3000ML PPV (MISCELLANEOUS) ×2 IMPLANT
CHIPS CANC BONE 20CC PCAN1/4 (Bone Implant) ×1 IMPLANT
CHIPS CANC BONE 40CC CAN1/2 (Bone Implant) ×1 IMPLANT
CLEANER TIP ELECTROSURG 2X2 (MISCELLANEOUS) ×1 IMPLANT
COVER SURGICAL LIGHT HANDLE (MISCELLANEOUS) ×2 IMPLANT
COVER WAND RF STERILE (DRAPES) ×2 IMPLANT
CUFF TOURN SGL QUICK 34 (TOURNIQUET CUFF) ×2
CUFF TRNQT CYL 34X4.125X (TOURNIQUET CUFF) ×1 IMPLANT
DRAIN PENROSE 1/4X12 LTX STRL (WOUND CARE) ×1 IMPLANT
DRAPE C-ARM 42X72 X-RAY (DRAPES) ×2 IMPLANT
DRAPE C-ARMOR (DRAPES) ×2 IMPLANT
DRAPE HALF SHEET 40X57 (DRAPES) IMPLANT
DRAPE INCISE IOBAN 66X45 STRL (DRAPES) ×2 IMPLANT
DRAPE U-SHAPE 47X51 STRL (DRAPES) ×2 IMPLANT
DRESSING MEPILEX FLEX 4X4 (GAUZE/BANDAGES/DRESSINGS) IMPLANT
DRILL BIT 2.5MM (BIT) ×2
DRILL BIT CAL (BIT) ×2
DRSG ADAPTIC 3X8 NADH LF (GAUZE/BANDAGES/DRESSINGS) ×2 IMPLANT
DRSG MEPILEX FLEX 4X4 (GAUZE/BANDAGES/DRESSINGS) ×2
DRSG MEPITEL 4X7.2 (GAUZE/BANDAGES/DRESSINGS) ×1 IMPLANT
DRSG PAD ABDOMINAL 8X10 ST (GAUZE/BANDAGES/DRESSINGS) ×4 IMPLANT
ELECT PENCIL ROCKER SW 15FT (MISCELLANEOUS) ×1 IMPLANT
ELECT REM PT RETURN 9FT ADLT (ELECTROSURGICAL) ×2
ELECTRODE REM PT RTRN 9FT ADLT (ELECTROSURGICAL) ×1 IMPLANT
GAUZE SPONGE 4X4 12PLY STRL (GAUZE/BANDAGES/DRESSINGS) ×2 IMPLANT
GAUZE SPONGE 4X4 16PLY XRAY LF (GAUZE/BANDAGES/DRESSINGS) ×1 IMPLANT
GLOVE BIO SURGEON STRL SZ7.5 (GLOVE) ×2 IMPLANT
GLOVE BIO SURGEON STRL SZ8 (GLOVE) ×2 IMPLANT
GLOVE BIOGEL PI IND STRL 7.5 (GLOVE) ×1 IMPLANT
GLOVE BIOGEL PI INDICATOR 7.5 (GLOVE) ×1
GLOVE SRG 8 PF TXTR STRL LF DI (GLOVE) ×1 IMPLANT
GLOVE SURG UNDER POLY LF SZ8 (GLOVE) ×2
GOWN STRL REUS W/ TWL LRG LVL3 (GOWN DISPOSABLE) ×2 IMPLANT
GOWN STRL REUS W/ TWL XL LVL3 (GOWN DISPOSABLE) ×1 IMPLANT
GOWN STRL REUS W/TWL LRG LVL3 (GOWN DISPOSABLE) ×4
GOWN STRL REUS W/TWL XL LVL3 (GOWN DISPOSABLE) ×2
GRAFT BNE CANC CHIPS 1-8 20CC (Bone Implant) IMPLANT
GRAFT BNE CHIP CANC 1-8 40 (Bone Implant) IMPLANT
IMMOBILIZER KNEE 20 (SOFTGOODS) ×2
IMMOBILIZER KNEE 20 THIGH 36 (SOFTGOODS) IMPLANT
IMMOBILIZER KNEE 22 UNIV (SOFTGOODS) ×2 IMPLANT
K-WIRE ACE 1.6X6 (WIRE) ×6
KIT BASIN OR (CUSTOM PROCEDURE TRAY) ×2 IMPLANT
KIT TURNOVER KIT B (KITS) ×2 IMPLANT
KWIRE ACE 1.6X6 (WIRE) IMPLANT
NDL SUT 6 .5 CRC .975X.05 MAYO (NEEDLE) IMPLANT
NEEDLE MAYO TAPER (NEEDLE)
NS IRRIG 1000ML POUR BTL (IV SOLUTION) ×2 IMPLANT
PACK ORTHO EXTREMITY (CUSTOM PROCEDURE TRAY) ×2 IMPLANT
PAD ABD 8X10 STRL (GAUZE/BANDAGES/DRESSINGS) ×1 IMPLANT
PAD ARMBOARD 7.5X6 YLW CONV (MISCELLANEOUS) ×4 IMPLANT
PAD CAST 4YDX4 CTTN HI CHSV (CAST SUPPLIES) ×1 IMPLANT
PADDING CAST COTTON 4X4 STRL (CAST SUPPLIES) ×2
PADDING CAST COTTON 6X4 STRL (CAST SUPPLIES) ×2 IMPLANT
PLATE LOCK 7H STD LT PROX TIB (Plate) ×1 IMPLANT
PLATE LOCK COMP 5H FOOT (Plate) ×1 IMPLANT
SCREW CORTICAL 3.5MM  34MM (Screw) ×2 IMPLANT
SCREW CORTICAL 3.5MM  42MM (Screw) ×2 IMPLANT
SCREW CORTICAL 3.5MM  55MM (Screw) ×2 IMPLANT
SCREW CORTICAL 3.5MM 34MM (Screw) IMPLANT
SCREW CORTICAL 3.5MM 36MM (Screw) ×1 IMPLANT
SCREW CORTICAL 3.5MM 38MM (Screw) ×1 IMPLANT
SCREW CORTICAL 3.5MM 42MM (Screw) IMPLANT
SCREW CORTICAL 3.5MM 50MM (Screw) ×1 IMPLANT
SCREW CORTICAL 3.5MM 55MM (Screw) IMPLANT
SCREW LOCK CORT STAR 3.5X65 (Screw) ×1 IMPLANT
SCREW LOCK CORT STAR 3.5X75 (Screw) ×4 IMPLANT
SCREW LP 3.5X70MM (Screw) ×2 IMPLANT
SPONGE LAP 18X18 RF (DISPOSABLE) ×2 IMPLANT
STAPLER VISISTAT 35W (STAPLE) ×2 IMPLANT
STOCKINETTE IMPERVIOUS LG (DRAPES) ×2 IMPLANT
SUCTION FRAZIER HANDLE 10FR (MISCELLANEOUS) ×2
SUCTION TUBE FRAZIER 10FR DISP (MISCELLANEOUS) ×1 IMPLANT
SUT ETHILON 3 0 PS 1 (SUTURE) IMPLANT
SUT PROLENE 0 CT 2 (SUTURE) ×4 IMPLANT
SUT VIC AB 0 CT1 27 (SUTURE) ×2
SUT VIC AB 0 CT1 27XBRD ANBCTR (SUTURE) ×1 IMPLANT
SUT VIC AB 1 CT1 27 (SUTURE) ×2
SUT VIC AB 1 CT1 27XBRD ANBCTR (SUTURE) ×1 IMPLANT
SUT VIC AB 2-0 CT1 27 (SUTURE) ×4
SUT VIC AB 2-0 CT1 TAPERPNT 27 (SUTURE) ×2 IMPLANT
TOWEL GREEN STERILE (TOWEL DISPOSABLE) ×4 IMPLANT
TOWEL GREEN STERILE FF (TOWEL DISPOSABLE) ×2 IMPLANT
TRAY FOLEY MTR SLVR 16FR STAT (SET/KITS/TRAYS/PACK) IMPLANT
TUBE CONNECTING 12X1/4 (SUCTIONS) ×2 IMPLANT
WATER STERILE IRR 1000ML POUR (IV SOLUTION) ×4 IMPLANT
YANKAUER SUCT BULB TIP NO VENT (SUCTIONS) ×2 IMPLANT
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
## 2020-09-15 NOTE — Anesthesia Procedure Notes (Signed)
Procedure Name: Intubation Date/Time: 09/15/2020 8:24 AM Performed by: Marny Lowenstein, CRNA Pre-anesthesia Checklist: Patient identified, Emergency Drugs available, Suction available and Patient being monitored Patient Re-evaluated:Patient Re-evaluated prior to induction Oxygen Delivery Method: Circle system utilized Preoxygenation: Pre-oxygenation with 100% oxygen Induction Type: IV induction Ventilation: Mask ventilation without difficulty Laryngoscope Size: Miller and 2 Grade View: Grade I Tube type: Oral Tube size: 7.0 mm Number of attempts: 1 Airway Equipment and Method: Patient positioned with wedge pillow and Stylet Placement Confirmation: ETT inserted through vocal cords under direct vision,  positive ETCO2 and breath sounds checked- equal and bilateral Secured at: 21 cm Tube secured with: Tape Dental Injury: Teeth and Oropharynx as per pre-operative assessment

## 2020-09-15 NOTE — Transfer of Care (Signed)
Immediate Anesthesia Transfer of Care Note  Patient: CHARLETTA VOIGHT  Procedure(s) Performed: OPEN REDUCTION INTERNAL FIXATION (ORIF) CONDYLE TIBIAL PLATEAU (Left )  Patient Location: PACU  Anesthesia Type:General  Level of Consciousness: drowsy  Airway & Oxygen Therapy: Patient Spontanous Breathing  Post-op Assessment: Report given to RN and Post -op Vital signs reviewed and stable  Post vital signs: Reviewed and stable  Last Vitals:  Vitals Value Taken Time  BP 137/75 09/15/20 1142  Temp    Pulse 70 09/15/20 1145  Resp 13 09/15/20 1145  SpO2 94 % 09/15/20 1145  Vitals shown include unvalidated device data.  Last Pain:  Vitals:   09/15/20 0400  TempSrc: Oral  PainSc:          Complications: No complications documented.

## 2020-09-15 NOTE — Progress Notes (Signed)
Patient returned to room from PACU. Alert and responsive. C/o pain to left leg and ankle. Refused to have f/c removed til in the morning. morphin given 1mg  for pain.

## 2020-09-15 NOTE — Anesthesia Preprocedure Evaluation (Signed)
Anesthesia Evaluation  Patient identified by MRN, date of birth, ID band Patient awake    Reviewed: Allergy & Precautions, NPO status , Patient's Chart, lab work & pertinent test results  History of Anesthesia Complications Negative for: history of anesthetic complications  Airway Mallampati: II  TM Distance: >3 FB Neck ROM: Full    Dental  (+) Dental Advisory Given   Pulmonary neg pulmonary ROS,    breath sounds clear to auscultation       Cardiovascular negative cardio ROS   Rhythm:Regular Rate:Normal     Neuro/Psych negative neurological ROS     GI/Hepatic negative GI ROS, Neg liver ROS,   Endo/Other  negative endocrine ROS  Renal/GU negative Renal ROS     Musculoskeletal   Abdominal   Peds  Hematology  (+) Blood dyscrasia (Hb 10.6), anemia ,   Anesthesia Other Findings   Reproductive/Obstetrics                             Anesthesia Physical Anesthesia Plan  ASA: I  Anesthesia Plan: General   Post-op Pain Management:    Induction: Intravenous  PONV Risk Score and Plan: 3 and Ondansetron, Dexamethasone and Scopolamine patch - Pre-op  Airway Management Planned: Oral ETT  Additional Equipment: None  Intra-op Plan:   Post-operative Plan: Extubation in OR  Informed Consent: I have reviewed the patients History and Physical, chart, labs and discussed the procedure including the risks, benefits and alternatives for the proposed anesthesia with the patient or authorized representative who has indicated his/her understanding and acceptance.     Dental advisory given  Plan Discussed with: CRNA and Surgeon  Anesthesia Plan Comments:         Anesthesia Quick Evaluation

## 2020-09-15 NOTE — Progress Notes (Signed)
No changes overnight. Eager for surgery.  I discussed with the patient the risks and benefits of this second stage surgery for left tibial plateau, including the possibility of infection, nerve injury, vessel injury, wound breakdown, arthritis, symptomatic hardware, DVT/ PE, loss of motion, malunion, nonunion, and need for further surgery among others.  She acknowledged these risks and wished to proceed.  Myrene Galas, MD [facility], [facility] [phone]

## 2020-09-15 NOTE — Anesthesia Postprocedure Evaluation (Signed)
Anesthesia Post Note  Patient: Patty [name]  Procedure(s) Performed: OPEN REDUCTION INTERNAL FIXATION (ORIF) CONDYLE TIBIAL PLATEAU (Left )     Patient location during evaluation: PACU Anesthesia Type: General Level of consciousness: patient cooperative, oriented and sedated Pain management: pain level controlled Vital Signs Assessment: post-procedure vital signs reviewed and stable Respiratory status: spontaneous breathing, nonlabored ventilation and respiratory function stable Cardiovascular status: blood pressure returned to baseline and stable Postop Assessment: no apparent nausea or vomiting Anesthetic complications: no   No complications documented.  Last Vitals:  Vitals:   09/15/20 1315 09/15/20 1330  BP: 120/62 119/63  Pulse: 79 72  Resp: 19 12  Temp:    SpO2: 90% 94%    Last Pain:  Vitals:   09/15/20 1315  TempSrc:   PainSc: Asleep                 Francile Woolford,E. Rhyli Depaula

## 2020-09-15 NOTE — Plan of Care (Signed)
  Problem: Pain Managment: Goal: General experience of comfort will improve Outcome: Progressing   Problem: Safety: Goal: Ability to remain free from injury will improve Outcome: Progressing   Problem: Skin Integrity: Goal: Risk for impaired skin integrity will decrease Outcome: Progressing   

## 2020-09-15 NOTE — Progress Notes (Signed)
Orthopedic Tech Progress Note Patient Details:  Patty [name] July 07, 1956 [id number] Ordered brace Patient ID: Patty [name], female   DOB: 08/26/1955, 65 y.o.   MRN: [id number]   Patty [name] 09/15/2020, 2:11 PM

## 2020-09-15 NOTE — Progress Notes (Signed)
PT Cancellation Note  Patient Details Name: Patty [name] MRN: [id number] DOB: Aug 02, 1955   Cancelled Treatment:    Reason Eval/Treat Not Completed: Patient at procedure or test/unavailable. Pt to OR this morning. Will check back as appropriate to continue with PT POC.    GWENDOLA HORNADAY 09/15/2020, 7:28 AM   Patty [name], PT, DPT Acute Rehabilitation Services Pager: [phone] Office: [phone]

## 2020-09-15 NOTE — TOC CAGE-AID Note (Signed)
Unable to complete Cage-aid at this time, pt is in the OR 

## 2020-09-15 NOTE — Progress Notes (Addendum)
03/10 0645 RN given report to Stephanie Copozzi,CRNA.   03/10 0655 Pt transported to OR. Pt denies pain. Pt stable.

## 2020-09-16 ENCOUNTER — Encounter (HOSPITAL_COMMUNITY): Payer: Self-pay | Admitting: Orthopedic Surgery

## 2020-09-16 MED ORDER — VITAMIN D 125 MCG (5000 UT) PO CAPS: 1.0000 | ORAL_CAPSULE | Freq: Every day | ORAL | 6 refills | Status: AC

## 2020-09-16 MED ORDER — ASCORBIC ACID 500 MG PO TABS: 500.0000 mg | ORAL_TABLET | Freq: Every day | ORAL | 1 refills | Status: AC

## 2020-09-16 MED ORDER — RIVAROXABAN 10 MG PO TABS: 10.0000 mg | ORAL_TABLET | Freq: Every day | ORAL | 0 refills | Status: AC

## 2020-09-16 MED ORDER — METHOCARBAMOL 500 MG PO TABS
1000.0000 mg | ORAL_TABLET | Freq: Three times a day (TID) | ORAL | Status: DC
  Administered 2020-09-16 – 2020-09-17 (×4): 1000 mg via ORAL
  Filled 2020-09-16 (×4): qty 2

## 2020-09-16 MED ORDER — DOCUSATE SODIUM 100 MG PO CAPS
100.0000 mg | ORAL_CAPSULE | Freq: Two times a day (BID) | ORAL | 0 refills | Status: AC
Start: 2020-09-16 — End: ?

## 2020-09-16 MED ORDER — METHOCARBAMOL 500 MG PO TABS: 500.0000 mg | ORAL_TABLET | Freq: Four times a day (QID) | ORAL | 0 refills | Status: AC | PRN

## 2020-09-16 MED ORDER — ACETAMINOPHEN 325 MG PO TABS: 500.0000 mg | ORAL_TABLET | Freq: Two times a day (BID) | ORAL | 0 refills | Status: AC

## 2020-09-16 MED ORDER — METHOCARBAMOL 1000 MG/10ML IJ SOLN
500.0000 mg | Freq: Three times a day (TID) | INTRAVENOUS | Status: DC
  Filled 2020-09-16: qty 5

## 2020-09-16 MED ORDER — HYDROCODONE-ACETAMINOPHEN 7.5-325 MG PO TABS: 1.0000 | ORAL_TABLET | Freq: Four times a day (QID) | ORAL | 0 refills | Status: AC | PRN

## 2020-09-16 NOTE — Progress Notes (Signed)
Orthopedic Tech Progress Note Patient Details:  Patty [name] [birth year] [id number]  Ortho Devices Type of Ortho Device: Bone foam zero knee Ortho Device/Splint Location: left Ortho Device/Splint Interventions: Ordered   Post Interventions Patient Tolerated: Other (comment) Instructions Provided: Other (comment)   Michelle Piper 09/16/2020, 12:53 PM

## 2020-09-16 NOTE — TOC CAGE-AID Note (Signed)
Transition of Care [facility]) - CAGE-AID Screening   Patient Details  Name: Patty [name] MRN: [id number] Date of Birth: 03-30-1956  Transition of Care [facility]) CM/SW Contact:    Carley Hammed, LCSWA Phone Number: 09/16/2020, 1:54 PM   Clinical Narrative: CSW completed CAGE- AID assessment with pt. Pt denies any substance abuse and declines counseling and resources at this time.   CAGE-AID Screening: Substance Abuse Screening unable to be completed due to: : Patient Refused  Have You Ever Felt You Ought to Cut Down on Your Drinking or Drug Use?: No Have People Annoyed You By Critizing Your Drinking Or Drug Use?: No Have You Felt Bad Or Guilty About Your Drinking Or Drug Use?: No Have You Ever Had a Drink or Used Drugs First Thing In The Morning to Steady Your Nerves or to Get Rid of a Hangover?: No CAGE-AID Score: 0  Substance Abuse Education Offered: No

## 2020-09-16 NOTE — Discharge Instructions (Signed)
Orthopaedic Trauma Service Discharge Instructions   General Discharge Instructions  Orthopaedic Injuries:  Left tibial plateau fracture treated with open reduction internal fixation using plate and screws  WEIGHT BEARING STATUS: Nonweightbearing left leg, use crutches or walker to mobilize  RANGE OF MOTION/ACTIVITY: Unrestricted range of motion of left knee and ankle.  No motion restrictions for left hip.  Activity as tolerated while maintaining weightbearing restrictions.  Wear knee brace when mobilizing.  You can take the knee brace off when you are in bed or seated in a chair  Bone health: Labs do show some vitamin D insufficiency.  Recommend taking 5000 IUs of vitamin D3 daily.  Would also recommend a bone density scan in the next 4 to 8 weeks  Wound Care: Daily dressing changes starting on 09/18/2020.  Please see below.  Can leave wounds open to air once they are completely dry.  Recommend using compression sock to help with swelling  Discharge Wound Care Instructions  Do NOT apply any ointments, solutions or lotions to pin sites or surgical wounds.  These prevent needed drainage and even though solutions like hydrogen peroxide kill bacteria, they also damage cells lining the pin sites that help fight infection.  Applying lotions or ointments can keep the wounds moist and can cause them to breakdown and open up as well. This can increase the risk for infection. When in doubt call the office.  Surgical incisions should be dressed daily.  If any drainage is noted, use one layer of adaptic, then gauze, Kerlix, and an ace wrap.  Once the incision is completely dry and without drainage, it may be left open to air out.  Showering may begin 36-48 hours later.  Cleaning gently with soap and water.  Traumatic wounds should be dressed daily as well.    One layer of adaptic, gauze, Kerlix, then ace wrap.  The adaptic can be discontinued once the draining has ceased    If you have a wet to  dry dressing: wet the gauze with saline the squeeze as much saline out so the gauze is moist (not soaking wet), place moistened gauze over wound, then place a dry gauze over the moist one, followed by Kerlix wrap, then ace wrap.  DVT/PE prophylaxis: Xarelto 10 mg daily x 4 weeks   Diet: as you were eating previously.  Can use over the counter stool softeners and bowel preparations, such as Miralax, to help with bowel movements.  Narcotics can be constipating.  Be sure to drink plenty of fluids  PAIN MEDICATION USE AND EXPECTATIONS  You have likely been given narcotic medications to help control your pain.  After a traumatic event that results in an fracture (broken bone) with or without surgery, it is ok to use narcotic pain medications to help control one's pain.  We understand that everyone responds to pain differently and each individual patient will be evaluated on a regular basis for the continued need for narcotic medications. Ideally, narcotic medication use should last no more than 6-8 weeks (coinciding with fracture healing).   As a patient it is your responsibility as well to monitor narcotic medication use and report the amount and frequency you use these medications when you come to your office visit.   We would also advise that if you are using narcotic medications, you should take a dose prior to therapy to maximize you participation.  IF YOU ARE ON NARCOTIC MEDICATIONS IT IS NOT PERMISSIBLE TO OPERATE A MOTOR VEHICLE (MOTORCYCLE/CAR/TRUCK/MOPED) OR HEAVY  MACHINERY DO NOT MIX NARCOTICS WITH OTHER CNS (CENTRAL NERVOUS SYSTEM) DEPRESSANTS SUCH AS ALCOHOL   STOP SMOKING OR USING NICOTINE PRODUCTS!!!!  As discussed nicotine severely impairs your body's ability to heal surgical and traumatic wounds but also impairs bone healing.  Wounds and bone heal by forming microscopic blood vessels (angiogenesis) and nicotine is a vasoconstrictor (essentially, shrinks blood vessels).  Therefore, if  vasoconstriction occurs to these microscopic blood vessels they essentially disappear and are unable to deliver necessary nutrients to the healing tissue.  This is one modifiable factor that you can do to dramatically increase your chances of healing your injury.    (This means no smoking, no nicotine gum, patches, etc)  DO NOT USE NONSTEROIDAL ANTI-INFLAMMATORY DRUGS (NSAID'S)  Using products such as Advil (ibuprofen), Aleve (naproxen), Motrin (ibuprofen) for additional pain control during fracture healing can delay and/or prevent the healing response.  If you would like to take over the counter (OTC) medication, Tylenol (acetaminophen) is ok.  However, some narcotic medications that are given for pain control contain acetaminophen as well. Therefore, you should not exceed more than 4000 mg of tylenol in a day if you do not have liver disease.  Also note that there are may OTC medicines, such as cold medicines and allergy medicines that my contain tylenol as well.  If you have any questions about medications and/or interactions please ask your doctor/PA or your pharmacist.      ICE AND ELEVATE INJURED/OPERATIVE EXTREMITY  Using ice and elevating the injured extremity above your heart can help with swelling and pain control.  Icing in a pulsatile fashion, such as 20 minutes on and 20 minutes off, can be followed.    Do not place ice directly on skin. Make sure there is a barrier between to skin and the ice pack.    Using frozen items such as frozen peas works well as the conform nicely to the are that needs to be iced.  USE AN ACE WRAP OR TED HOSE FOR SWELLING CONTROL  In addition to icing and elevation, Ace wraps or TED hose are used to help limit and resolve swelling.  It is recommended to use Ace wraps or TED hose until you are informed to stop.    When using Ace Wraps start the wrapping distally (farthest away from the body) and wrap proximally (closer to the body)   Example: If you had surgery on  your leg or thing and you do not have a splint on, start the ace wrap at the toes and work your way up to the thigh        If you had surgery on your upper extremity and do not have a splint on, start the ace wrap at your fingers and work your way up to the upper arm  IF YOU ARE IN A SPLINT OR CAST DO NOT REMOVE IT FOR ANY REASON   If your splint gets wet for any reason please contact the office immediately. You may shower in your splint or cast as long as you keep it dry.  This can be done by wrapping in a cast cover or garbage back (or similar)  Do Not stick any thing down your splint or cast such as pencils, money, or hangers to try and scratch yourself with.  If you feel itchy take benadryl as prescribed on the bottle for itching  IF YOU ARE IN A CAM BOOT (BLACK BOOT)  You may remove boot periodically. Perform daily dressing changes  as noted below.  Wash the liner of the boot regularly and wear a sock when wearing the boot. It is recommended that you sleep in the boot until told otherwise    Call office for the following:  Temperature greater than 101F  Persistent nausea and vomiting  Severe uncontrolled pain  Redness, tenderness, or signs of infection (pain, swelling, redness, odor or green/yellow discharge around the site)  Difficulty breathing, headache or visual disturbances  Hives  Persistent dizziness or light-headedness  Extreme fatigue  Any other questions or concerns you may have after discharge  In an emergency, call 911 or go to an Emergency Department at a nearby hospital  HELPFUL INFORMATION  ? If you had a block, it will wear off between 8-24 hrs postop typically.  This is period when your pain may go from nearly zero to the pain you would have had postop without the block.  This is an abrupt transition but nothing dangerous is happening.  You may take an extra dose of narcotic when this happens.  ? You should wean off your narcotic medicines as soon as you are  able.  Most patients will be off or using minimal narcotics before their first postop appointment.   ? We suggest you use the pain medication the first night prior to going to bed, in order to ease any pain when the anesthesia wears off. You should avoid taking pain medications on an empty stomach as it will make you nauseous.  ? Do not drink alcoholic beverages or take illicit drugs when taking pain medications.  ? In most states it is against the law to drive while you are in a splint or sling.  And certainly against the law to drive while taking narcotics.  ? You may return to work/school in the next couple of days when you feel up to it.   ? Pain medication may make you constipated.  Below are a few solutions to try in this order: - Decrease the amount of pain medication if you aren't having pain. - Drink lots of decaffeinated fluids. - Drink prune juice and/or each dried prunes  o If the first 3 don't work start with additional solutions - Take Colace - an over-the-counter stool softener - Take Senokot - an over-the-counter laxative - Take Miralax - a stronger over-the-counter laxative     CALL THE OFFICE WITH ANY QUESTIONS OR CONCERNS: [phone]   VISIT OUR WEBSITE FOR ADDITIONAL INFORMATION: [email or web address]

## 2020-09-16 NOTE — Plan of Care (Signed)

## 2020-09-16 NOTE — Progress Notes (Signed)
Orthopaedic Trauma Service Progress Note  Patient ID: Patty [name] MRN: [id number] DOB/AGE: 07/25/1955 65 y.o.  Subjective:  Doing okay currently Pretty severe pain last night including significant muscle spasms in the back of her left leg  No other complaints   ROS As above Objective:   VITALS:   Vitals:   09/15/20 2354 09/16/20 0252 09/16/20 0643 09/16/20 0729  BP: 129/68 128/66 131/72 132/72  Pulse: 76 82 80 75  Resp: 16 16 16 16   Temp: 99 F (37.2 C) 98.9 F (37.2 C) 99 F (37.2 C) 98.2 F (36.8 C)  TempSrc: Oral Oral Oral Oral  SpO2: 98% 97% 97% 93%  Weight:      Height:        Estimated body mass index is 17.65 kg/m as calculated from the following:   Height as of this encounter: 5\' 9"  (1.753 m).   Weight as of this encounter: 54.2 kg.   Intake/Output      03/10 0701 03/11 0700 03/11 0701 03/12 0700   P.O.     I.V. (mL/kg) 1700 (31.4)    IV Piggyback 300    Total Intake(mL/kg) 2000 (36.9)    Urine (mL/kg/hr) 4550 (3.5)    Stool 0    Blood 50    Total Output 4600    Net -2600           LABS  No results found for this or any previous visit (from the past 24 hour(s)).   PHYSICAL EXAM:   Gen: in bed, NAD, appears well  Lungs: unlabored  Cardiac: Reg Ext:       Left Lower Extremity              dressing clean, dry and intact  Hinged knee brace is fitting well, it is unlocked             Ext warm              Swelling stable              Distal motor and sensory functions intact             Good active ankle extension              No pain out of proportion with passive stretch              + DP pulse              No DCT    Assessment/Plan: 1 Day Post-Op   Principal Problem:   Tibial plateau fracture, left, closed, initial encounter Active Problems:   Vitamin D insufficiency   Anti-infectives (From admission, onward)   Start     Dose/Rate Route  Frequency Ordered Stop   09/15/20 1500  ceFAZolin (ANCEF) IVPB 2g/100 mL premix        2 g 200 mL/hr over 30 Minutes Intravenous Every 8 hours 09/15/20 1401 09/16/20 0529   09/15/20 0803  ceFAZolin (ANCEF) 2-4 GM/100ML-% IVPB       Note to Pharmacy: 11/16/20  : cabinet override      09/15/20 0803 09/15/20 2014   09/13/20 1130  ceFAZolin (ANCEF) IVPB 2g/100 mL premix        2 g 200 mL/hr over 30 Minutes Intravenous On call to O.R.  09/12/20 1232 09/14/20 0559   09/11/20 1600  ceFAZolin (ANCEF) IVPB 1 g/50 mL premix        1 g 100 mL/hr over 30 Minutes Intravenous Every 6 hours 09/11/20 1159 09/12/20 0439   09/11/20 0915  ceFAZolin (ANCEF) IVPB 2g/100 mL premix        2 g 200 mL/hr over 30 Minutes Intravenous  Once 09/11/20 0911 09/11/20 1030   09/11/20 0910  ceFAZolin (ANCEF) 2-4 GM/100ML-% IVPB       Note to Pharmacy: Patty [name]   : cabinet override      09/11/20 0910 09/11/20 1009    .  POD/HD#: 1  65 y/o female with L bicondylar tibial plateau fracture   -L bicondylar tibial plateau fracture s/p ORIF               NWB L leg x 8 weeks, crutches or walker for ambulation  Unrestricted range of motion left knee   Hinged knee brace when mobilizing.  Can take knee brace off when in bed or chair              Ice and elevate for swelling and pain control             Ok to mobilize with therapies    Dressing changes starting on 09/18/2020   Okay to leave open to the air once drainage stops and can shower at that time as well   - Pain management:             Multimodal              Titrate accordingly    - ABL anemia/Hemodynamics             stable   - Medical issues              Vitamin d insufficiency                          Supplement   - DVT/PE prophylaxis:             Lovenox   Start Xarelto tomorrow   - ID:              periop abx   - Metabolic Bone Disease:             As above              Recommend DEXA in 4-8 weeks    - Activity:              NWB L leg              - FEN/GI prophylaxis/Foley/Lines:             Reg diet             DC Foley today               - Impediments to fracture healing:             Vitamin d insufficiency   Clinically poor bone quality   - Dispo:             Mobilize today with therapies  TOC consult her home needs  DC home tomorrow     Patty Latin, PA-C [phone] (C) 09/16/2020, 11:52 AM  [facility] [address] [place] [address] [phone] Patty [name] (F)    After 5pm and on the weekends please  log on to Amion, go to orthopaedics and the look under the [facility] Call for the provider(s) on call. You can also call our office at [phone] and then follow the prompts to be connected to the call team.
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
## 2020-09-16 NOTE — Plan of Care (Signed)
  Problem: Education: Goal: Knowledge of General Education information will improve Description: Including pain rating scale, medication(s)/side effects and non-pharmacologic comfort measures Outcome: Adequate for Discharge   
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
## 2020-09-17 MED ORDER — LACTATED RINGERS IV BOLUS
500.0000 mL | Freq: Once | INTRAVENOUS | Status: AC
  Administered 2020-09-17: 500 mL via INTRAVENOUS

## 2020-09-17 NOTE — Plan of Care (Signed)

## 2020-09-17 NOTE — TOC Initial Note (Signed)
Transition of Care [facility]) - Initial/Assessment Note    Patient Details  Name: Patty [name] MRN: [id number] Date of Birth: [birth year]  [facility] CM/SW Contact:    Janae Bridgeman, RN Phone Number: 09/17/2020, 12:16 PM  Clinical Narrative:                 Case management called and was unable to reach the patient regarding discharge plans for transitions of care to home.  I called and spoke to the patient's husband, Jonny Ruiz, on the phone for discharge needs.  The patient's spouse was given choice regarding home health and dme services and the husband was agreeable to home health services - atleast for an initial evaluation and treatment in the home and was updated with [facility] name for pending call for appointment at home.  I sent a message to PT requesting copy of home PT/OT exercises at the husband's request.  The husband states that they already have a bedside commode and crutches at home but will need a RW for home.    CM will continue to follow the patient for discharge planning needs for home.  Expected Discharge Plan: Home w Home Health Services Barriers to Discharge: No Barriers Identified   Patient Goals and CMS Choice Patient states their goals for this hospitalization and ongoing recovery are:: Patient's husband looks forward to her "getting better and going home". CMS Medicare.gov Compare Post Acute Care list provided to:: Patient Represenative (must comment) (Patient's husband.) Choice offered to / list presented to : Spouse  Expected Discharge Plan and Services Expected Discharge Plan: Home w Home Health Services   Discharge Planning Services: CM Consult Post Acute Care Choice: Home Health,Durable Medical Equipment Living arrangements for the past 2 months: Single Family Home Expected Discharge Date: 09/17/20               DME Arranged: Dan Humphreys rolling DME Agency: [employer] Date DME Agency Contacted: 09/17/20 Time DME Agency Contacted:  1213 Representative spoke with at DME Agency: Rolling walker to be obtained from unit closet. HH Arranged: PT,OT HH Agency: [facility] Date [facility] Agency Contacted: 09/17/20 Time HH Agency Contacted: 1213 Representative spoke with at [facility] Agency: Delila Spence  Prior Living Arrangements/Services Living arrangements for the past 2 months: Single Family Home Lives with:: Spouse,Adult Children Patient language and need for interpreter reviewed:: Yes Do you feel safe going back to the place where you live?: Yes      Need for Family Participation in Patient Care: Yes (Comment) Care giver support system in place?: Yes (comment) Current home services: DME (has bedside commode at home per husband) Criminal Activity/Legal Involvement Pertinent to Current Situation/Hospitalization: No - Comment as needed  Activities of Daily Living Home Assistive Devices/Equipment: None ADL Screening (condition at time of admission) Patient's cognitive ability adequate to safely complete daily activities?: Yes Is the patient deaf or have difficulty hearing?: No Does the patient have difficulty seeing, even when wearing glasses/contacts?: No Does the patient have difficulty concentrating, remembering, or making decisions?: No Patient able to express need for assistance with ADLs?: Yes Does the patient have difficulty dressing or bathing?: No Independently performs ADLs?: No Communication: Independent Dressing (OT): Needs assistance Grooming: Independent Feeding: Independent Bathing: Needs assistance Toileting: Needs assistance Is this a change from baseline?: Change from baseline, expected to last <3 days In/Out Bed: Needs assistance Is this a change from baseline?: Change from baseline, expected to last <3 days Walks in Home: Needs assistance Is this  a change from baseline?: Change from baseline, expected to last <3 days Does the patient have difficulty walking or climbing stairs?: Yes Weakness of  Legs: Both Weakness of Arms/Hands: None  Permission Sought/Granted Permission sought to share information with : Case Manager,Family Supports Permission granted to share information with : Yes, Verbal Permission Granted     Permission granted to share info w AGENCY: Home Health Agency, DME company for RW  Permission granted to share info w Relationship: spouse - [phone]     Emotional Assessment Appearance:: Appears stated age     Orientation: : Oriented to Self,Oriented to Place,Oriented to  Time,Oriented to Situation Alcohol / Substance Use: Not Applicable Psych Involvement: No (comment)  Admission diagnosis:  Surgery, elective [Z41.9] Tibial plateau fracture [S82.143A] Tibial plateau fracture, left, closed, initial encounter [S82.142A] Patient Active Problem List   Diagnosis Date Noted  . Vitamin D insufficiency 09/12/2020  . Tibial plateau fracture, left, closed, initial encounter 09/10/2020  . Pilonidal cyst    PCP:  Pcp, No Pharmacy:   [id number] - [place], [place] - [address] [place] [address] Phone: [phone] Fax: [phone]     Social Determinants of Health (SDOH) Interventions    Readmission Risk Interventions Readmission Risk Prevention Plan 09/17/2020  Post Dischage Appt Complete  Medication Screening Complete  Transportation Screening Complete  Some recent data might be hidden

## 2020-09-17 NOTE — Progress Notes (Signed)
Discharge instructions addressed; Pt in stable condition;Rolling walker provided; Pt.'s husband picked her up at the [employer] entrance.
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
## 2020-09-17 NOTE — Progress Notes (Signed)
PT Cancellation Note  Patient Details Name: Patty [name] MRN: [id number] DOB: 1956-03-09   Cancelled Treatment:    Reason Eval/Treat Not Completed: (P) Other (comment) (eating breakfast will return for PT session to review stairs before d/c home.)   Florestine Avers 09/17/2020, 9:26 AM  Bonney Leitz , PTA Acute Rehabilitation Services Pager [phone] Office [phone]
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
## 2022-03-12 IMAGING — CT CT KNEE*L* W/O CM
3 series · 9 of 33 positions shown, 11 images · non-contrast
Comparison: Left knee x-rays from yesterday.

CLINICAL DATA: Left tibial plateau fracture status post external
fixation.

EXAM:
CT OF THE LEFT KNEE WITHOUT CONTRAST
TECHNIQUE: Multidetector CT imaging of the left knee was performed according to
the standard protocol. Multiplanar CT image reconstructions were
also generated. 3-dimensional CT images were rendered by
post-processing of the original CT data on an acquisition
workstation. The 3-dimensional CT images were interpreted and
findings were reported in the accompanying complete CT report for
this study.

[Series 4: extremity soft tissue · axial · 0.38mm/px · z∈[+754,+754]mm · 1 of 163 slices shown, 2 images]
[im 88/163  soft-tissue]
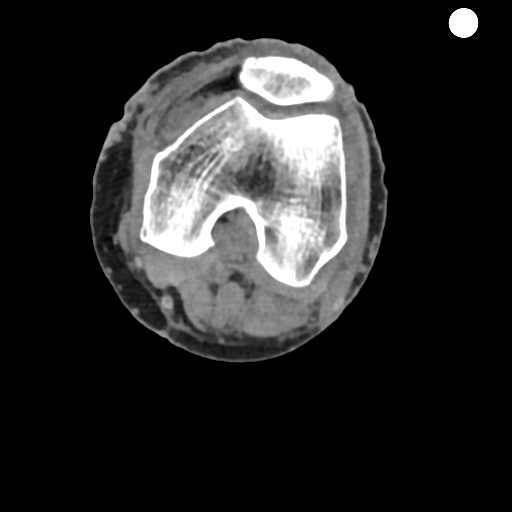
[im 88/163  bone]
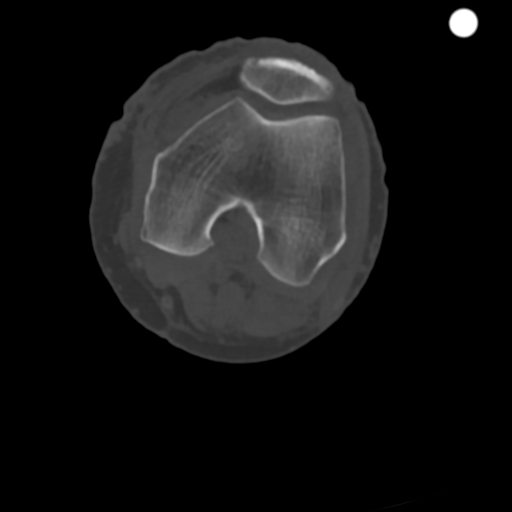

[Series 8: cor soft tissue · coronal · 0.29mm/px · 3 of 83 slices shown]
[im 17/83  bone]
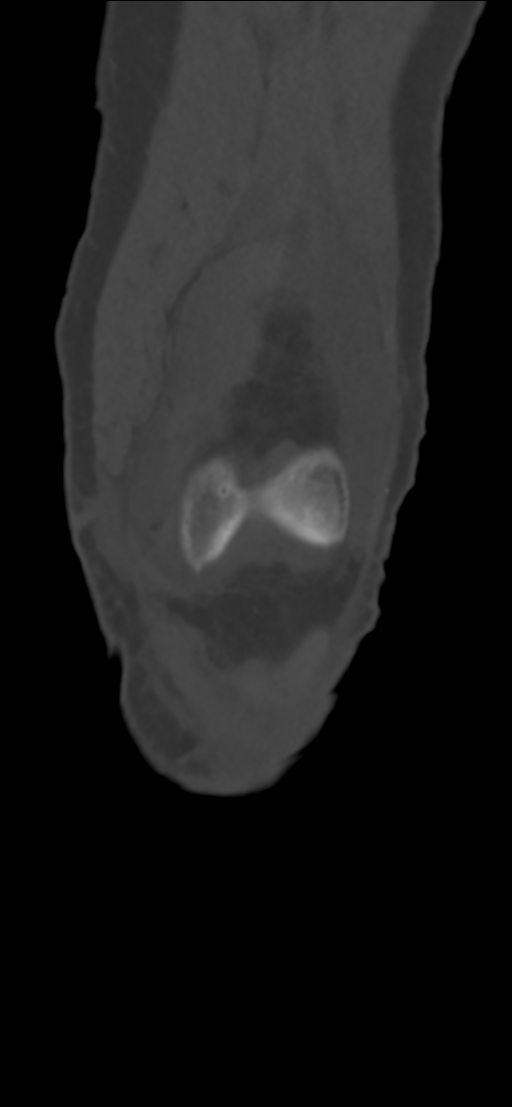
[im 33/83  bone]
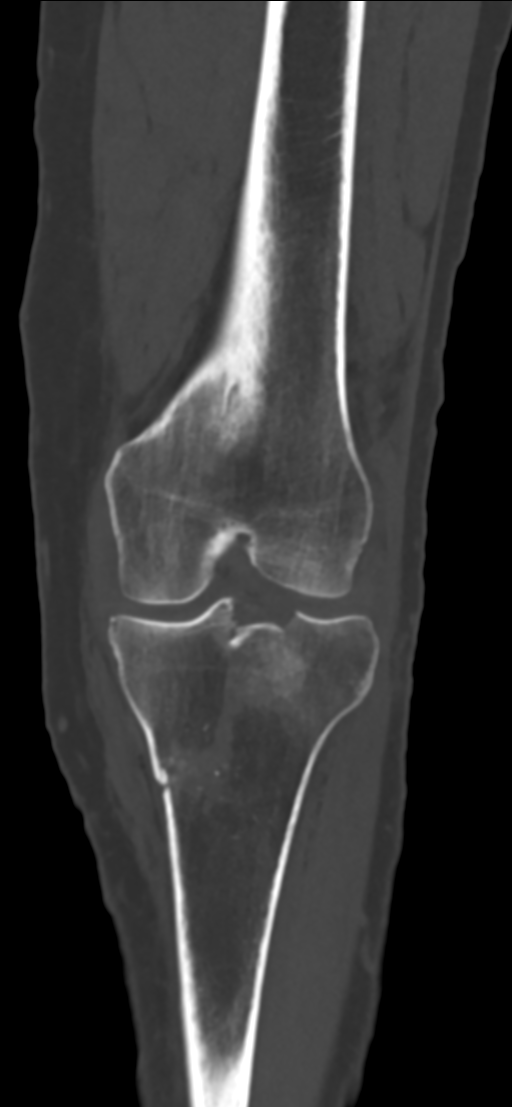
[im 50/83  bone]
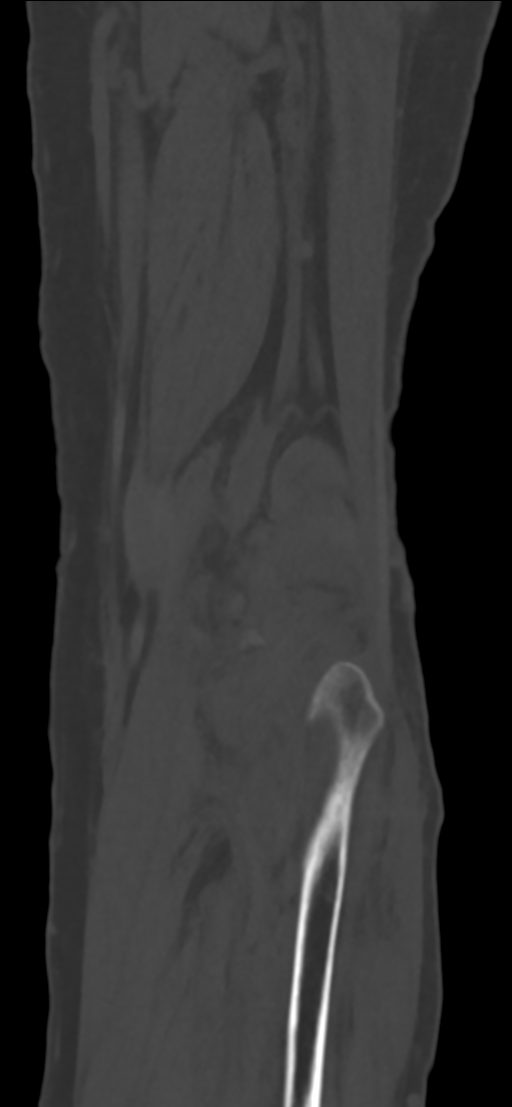

[Series 9: sag soft tissue · sagittal · 0.32mm/px · 5 of 64 slices shown, 6 images]
[im 22/64  bone]
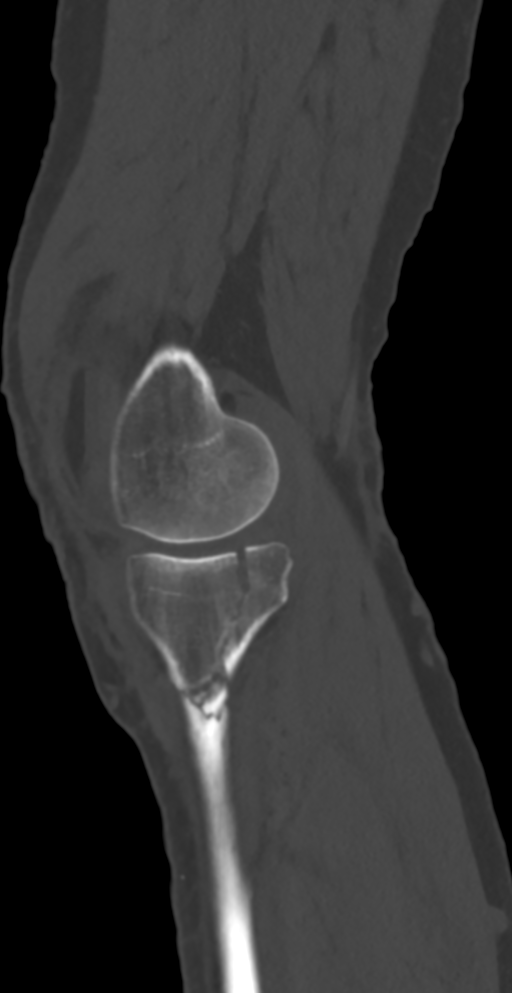
[im 27/64  bone]
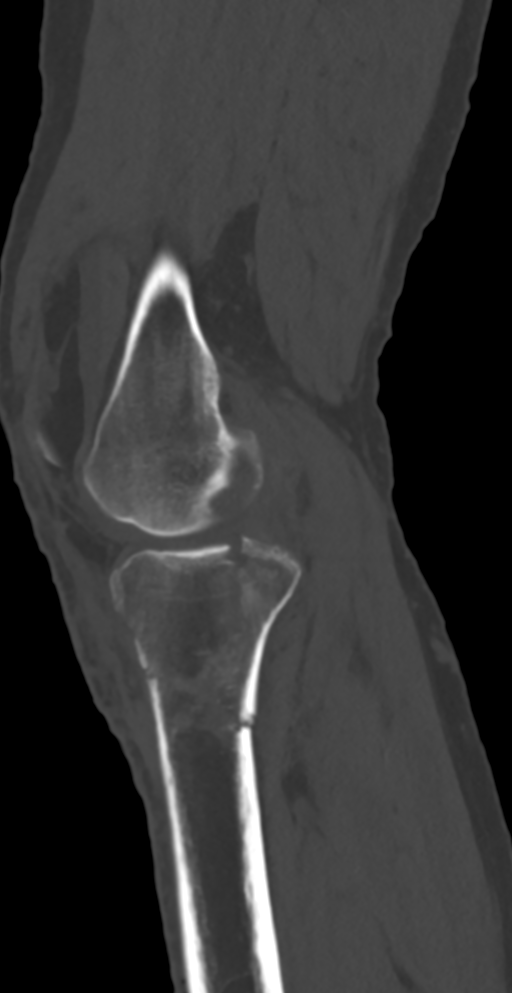
[im 32/64  soft-tissue]
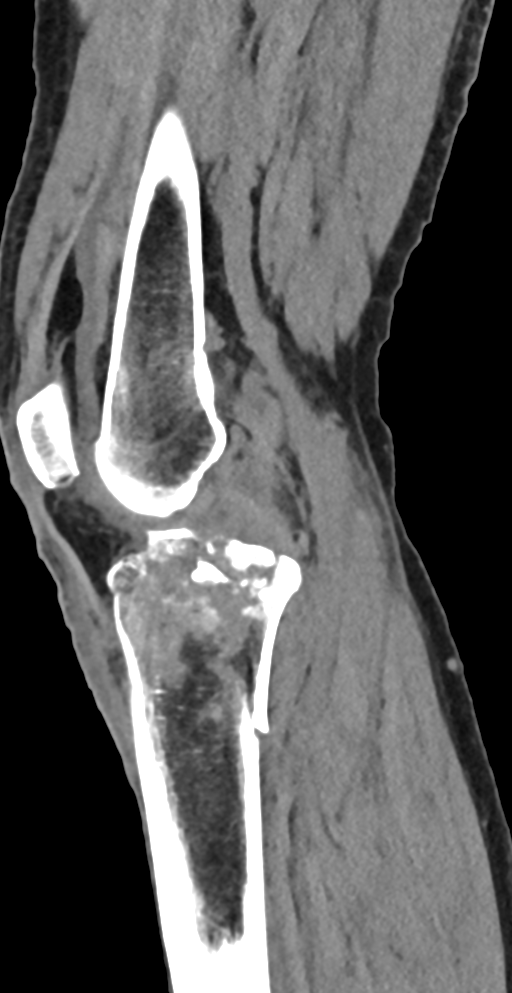
[im 32/64  bone]
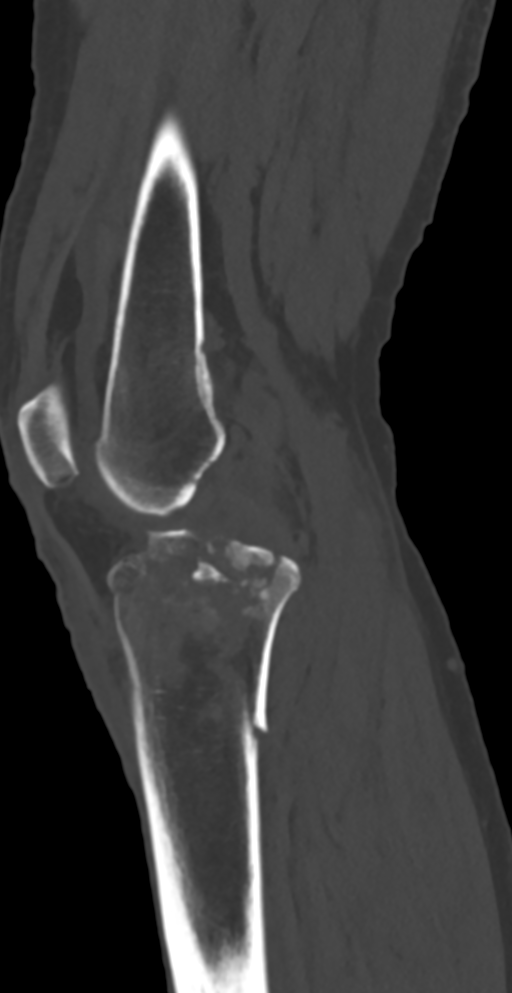
[im 37/64  bone]
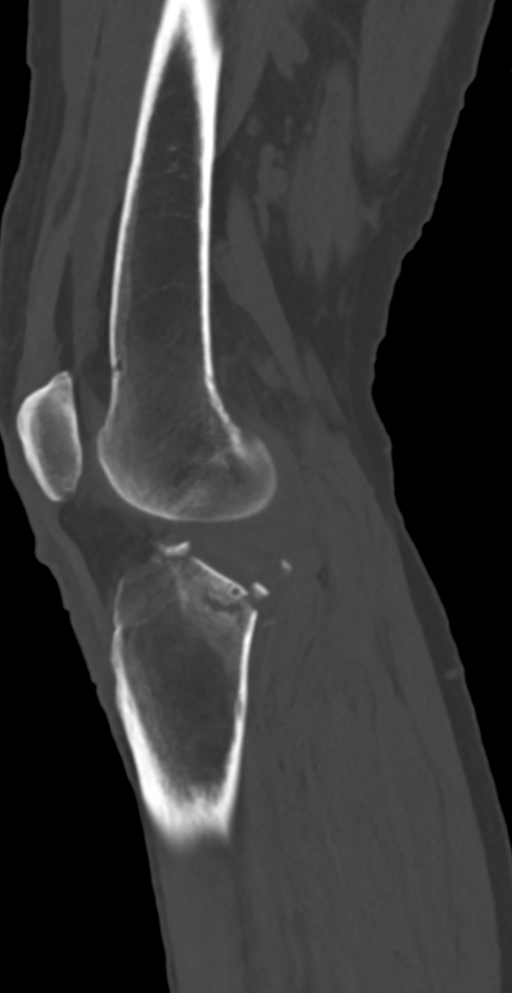
[im 43/64  bone]
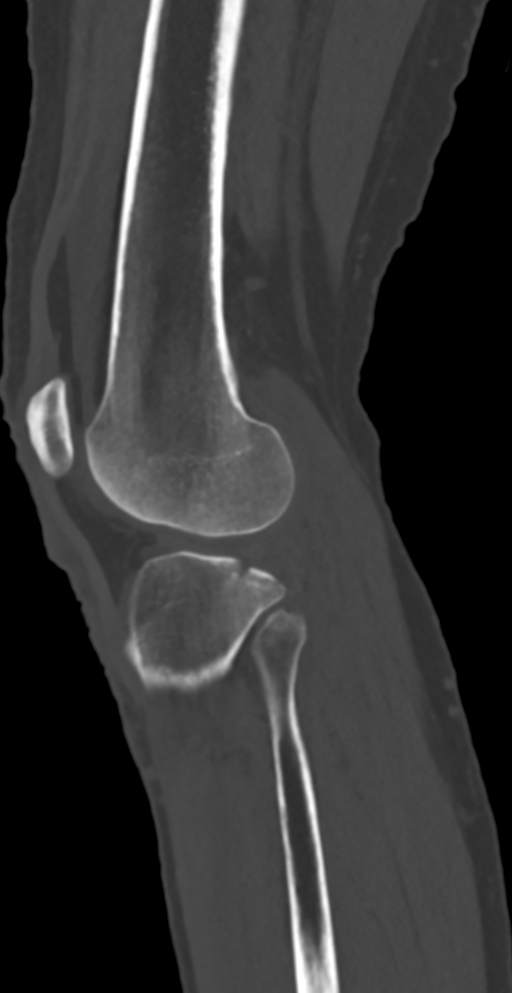

[9 of 33 positions shown; findings below may reference images not displayed]

FINDINGS: Bones/Joint/Cartilage

Acute comminuted fracture of the medial and lateral tibial plateau
with involvement of the tibial spine. There is approximately 9 mm
depression of the lateral tibial plateau articular surface.
Longitudinal component through the posterior aspect of the tibial
spine with 4 mm posterior displacement. Relatively nondisplaced
longitudinal and oblique components through the medial tibial
plateau. No additional fracture. No dislocation. Joint spaces are
preserved. Large lipohemarthrosis again noted.

Ligaments

Ligaments are suboptimally evaluated by CT.

Muscles and Tendons
Grossly intact.  No muscle atrophy.

Soft tissue
No fluid collection or hematoma.  No soft tissue mass.
IMPRESSION: 1. Acute comminuted depressed and mildly displaced bicondylar tibial
plateau fracture as described above.
2. Large lipohemarthrosis.

## 2022-03-12 IMAGING — RF DG C-ARM 1-60 MIN
1 series · 2 of 2 positions shown · non-contrast
Comparison: None.

CLINICAL DATA: Tibial plateau fracture repair

EXAM:
DG C-ARM 1-60 MIN
FLUOROSCOPY TIME:  Fluoroscopy Time:  14 seconds
Radiation Exposure Index (if provided by the fluoroscopic device):
0.51 mGy
Number of Acquired Spot Images: 0

[Series 1: run · 2 of 2 slices shown]
[im 1/2]
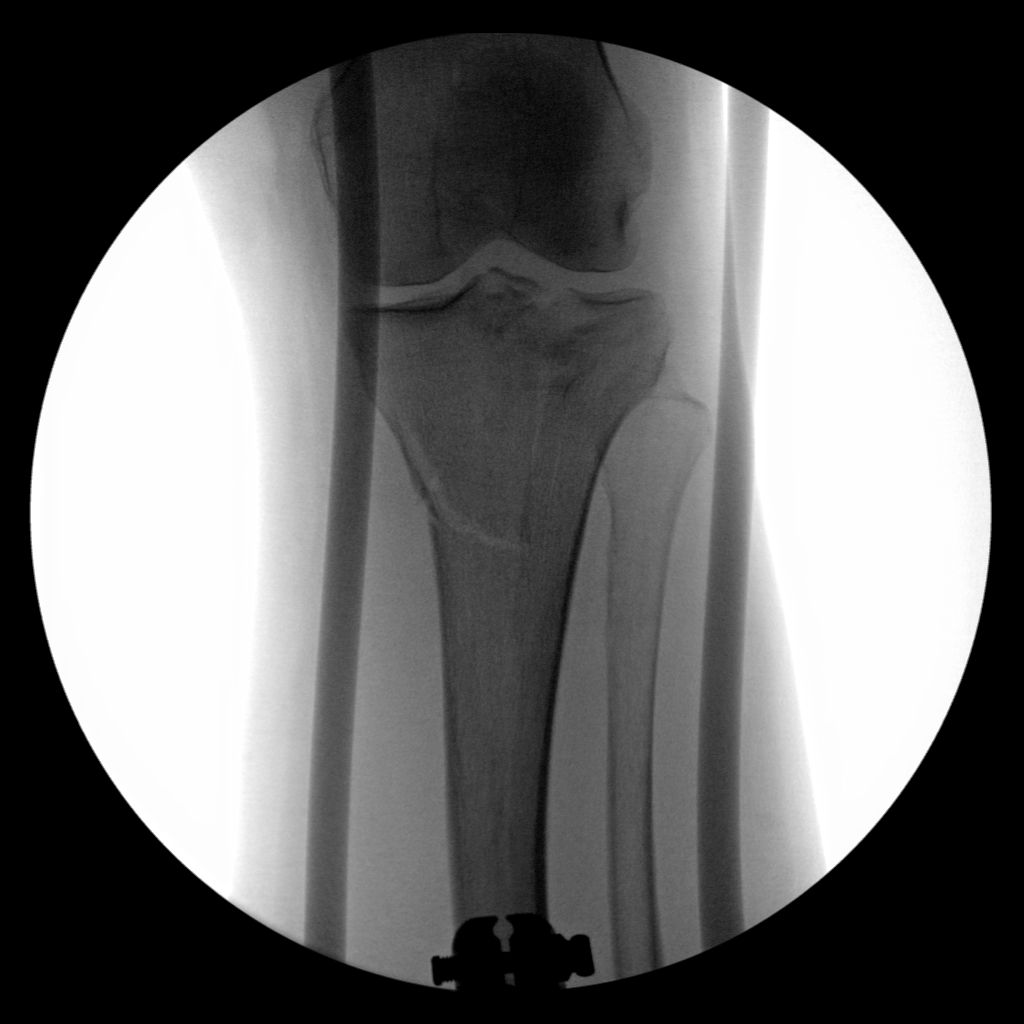
[im 2/2]
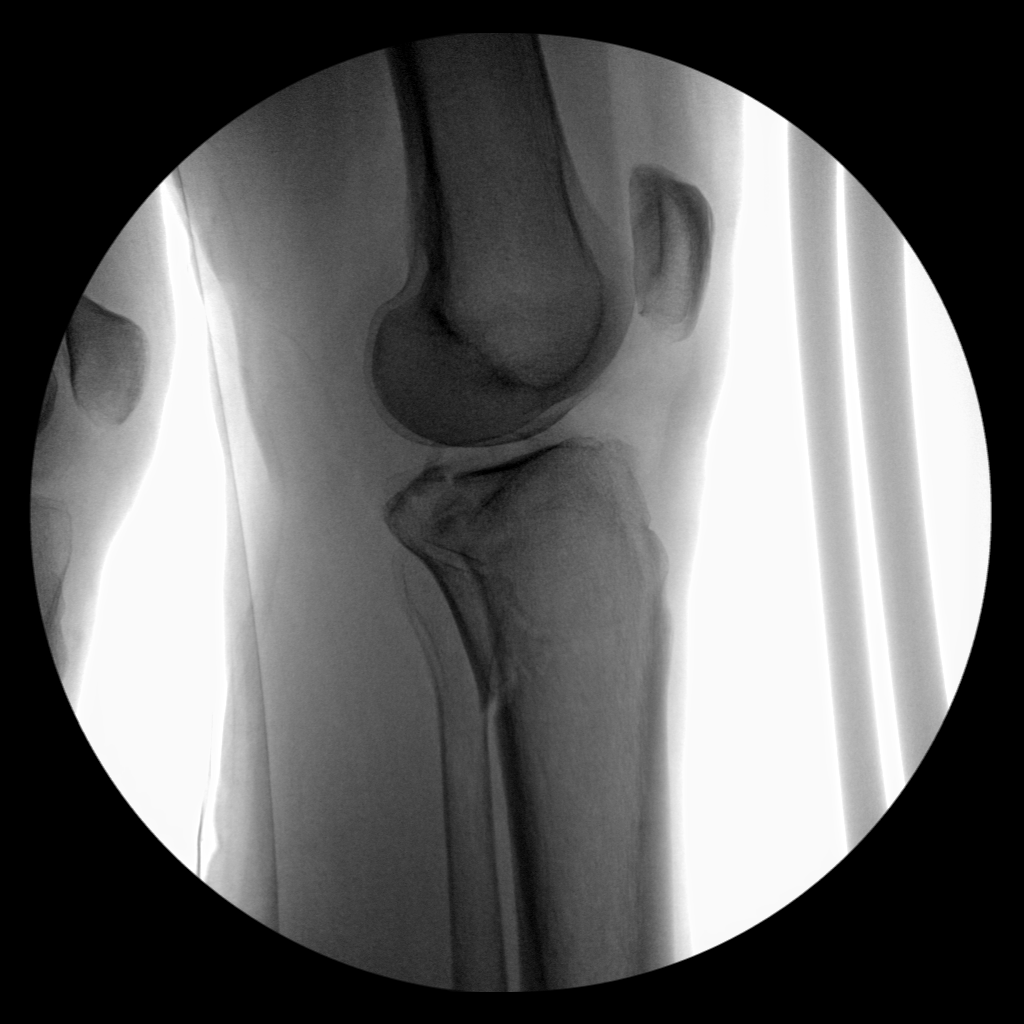

[2 of 2 positions shown; findings below may reference images not displayed]

FINDINGS: Two images were obtained demonstrating the known tibial plateau
fracture. Alignment of the fracture fragment had improved. Imaging
was obtained prior to any hardware placement.
IMPRESSION: Improved alignment of the patient's known tibial fracture. Imaging
was obtained prior to hardware placement.

## 2022-07-17 DIAGNOSIS — H2513 Age-related nuclear cataract, bilateral: Secondary | ICD-10-CM | POA: Diagnosis not present

## 2022-07-17 DIAGNOSIS — H52223 Regular astigmatism, bilateral: Secondary | ICD-10-CM | POA: Diagnosis not present

## 2022-07-17 DIAGNOSIS — H5203 Hypermetropia, bilateral: Secondary | ICD-10-CM | POA: Diagnosis not present

## 2022-07-17 DIAGNOSIS — H04123 Dry eye syndrome of bilateral lacrimal glands: Secondary | ICD-10-CM | POA: Diagnosis not present

## 2022-07-17 DIAGNOSIS — H524 Presbyopia: Secondary | ICD-10-CM | POA: Diagnosis not present

## 2022-07-17 DIAGNOSIS — H40013 Open angle with borderline findings, low risk, bilateral: Secondary | ICD-10-CM | POA: Diagnosis not present

## 2022-07-18 DIAGNOSIS — K625 Hemorrhage of anus and rectum: Secondary | ICD-10-CM | POA: Diagnosis not present

## 2022-07-18 DIAGNOSIS — Z124 Encounter for screening for malignant neoplasm of cervix: Secondary | ICD-10-CM | POA: Diagnosis not present

## 2022-07-18 DIAGNOSIS — Z6821 Body mass index (BMI) 21.0-21.9, adult: Secondary | ICD-10-CM | POA: Diagnosis not present

## 2022-07-18 DIAGNOSIS — Z01419 Encounter for gynecological examination (general) (routine) without abnormal findings: Secondary | ICD-10-CM | POA: Diagnosis not present

## 2022-07-18 DIAGNOSIS — M810 Age-related osteoporosis without current pathological fracture: Secondary | ICD-10-CM | POA: Diagnosis not present

## 2022-07-18 DIAGNOSIS — Z1231 Encounter for screening mammogram for malignant neoplasm of breast: Secondary | ICD-10-CM | POA: Diagnosis not present

## 2022-07-18 DIAGNOSIS — Z1211 Encounter for screening for malignant neoplasm of colon: Secondary | ICD-10-CM | POA: Diagnosis not present

## 2022-07-19 DIAGNOSIS — D2271 Melanocytic nevi of right lower limb, including hip: Secondary | ICD-10-CM | POA: Diagnosis not present

## 2022-07-19 DIAGNOSIS — L918 Other hypertrophic disorders of the skin: Secondary | ICD-10-CM | POA: Diagnosis not present

## 2022-07-19 DIAGNOSIS — D2262 Melanocytic nevi of left upper limb, including shoulder: Secondary | ICD-10-CM | POA: Diagnosis not present

## 2022-07-19 DIAGNOSIS — D2272 Melanocytic nevi of left lower limb, including hip: Secondary | ICD-10-CM | POA: Diagnosis not present

## 2022-07-19 DIAGNOSIS — D2239 Melanocytic nevi of other parts of face: Secondary | ICD-10-CM | POA: Diagnosis not present

## 2022-07-19 DIAGNOSIS — L814 Other melanin hyperpigmentation: Secondary | ICD-10-CM | POA: Diagnosis not present

## 2022-07-19 DIAGNOSIS — D225 Melanocytic nevi of trunk: Secondary | ICD-10-CM | POA: Diagnosis not present

## 2022-07-19 DIAGNOSIS — D2261 Melanocytic nevi of right upper limb, including shoulder: Secondary | ICD-10-CM | POA: Diagnosis not present

## 2022-07-19 DIAGNOSIS — L578 Other skin changes due to chronic exposure to nonionizing radiation: Secondary | ICD-10-CM | POA: Diagnosis not present

## 2022-08-29 DIAGNOSIS — K649 Unspecified hemorrhoids: Secondary | ICD-10-CM | POA: Diagnosis not present

## 2022-08-29 DIAGNOSIS — Z86010 Personal history of colonic polyps: Secondary | ICD-10-CM | POA: Diagnosis not present

## 2022-08-29 DIAGNOSIS — K5904 Chronic idiopathic constipation: Secondary | ICD-10-CM | POA: Diagnosis not present

## 2022-08-29 DIAGNOSIS — K625 Hemorrhage of anus and rectum: Secondary | ICD-10-CM | POA: Diagnosis not present

## 2022-09-18 DIAGNOSIS — L2489 Irritant contact dermatitis due to other agents: Secondary | ICD-10-CM | POA: Diagnosis not present

## 2022-11-15 ENCOUNTER — Ambulatory Visit
Admission: RE | Admit: 2022-11-15 | Discharge: 2022-11-15 | Disposition: A | Discharge status: Home / Self Care | Payer: Medicare PPO | Source: Ambulatory Visit | Attending: Family Medicine | Admitting: Family Medicine

## 2022-11-15 ENCOUNTER — Other Ambulatory Visit: Payer: Self-pay | Admitting: Family Medicine

## 2022-11-15 DIAGNOSIS — M79645 Pain in left finger(s): Secondary | ICD-10-CM | POA: Diagnosis not present

## 2022-11-26 DIAGNOSIS — S62635A Displaced fracture of distal phalanx of left ring finger, initial encounter for closed fracture: Secondary | ICD-10-CM | POA: Diagnosis not present

## 2022-12-10 DIAGNOSIS — M25642 Stiffness of left hand, not elsewhere classified: Secondary | ICD-10-CM | POA: Diagnosis not present

## 2022-12-17 DIAGNOSIS — S62635A Displaced fracture of distal phalanx of left ring finger, initial encounter for closed fracture: Secondary | ICD-10-CM | POA: Diagnosis not present

## 2023-01-01 DIAGNOSIS — M810 Age-related osteoporosis without current pathological fracture: Secondary | ICD-10-CM | POA: Diagnosis not present

## 2023-01-07 DIAGNOSIS — S62635D Displaced fracture of distal phalanx of left ring finger, subsequent encounter for fracture with routine healing: Secondary | ICD-10-CM | POA: Diagnosis not present

## 2023-01-21 DIAGNOSIS — D2272 Melanocytic nevi of left lower limb, including hip: Secondary | ICD-10-CM | POA: Diagnosis not present

## 2023-01-21 DIAGNOSIS — D224 Melanocytic nevi of scalp and neck: Secondary | ICD-10-CM | POA: Diagnosis not present

## 2023-01-21 DIAGNOSIS — D225 Melanocytic nevi of trunk: Secondary | ICD-10-CM | POA: Diagnosis not present

## 2023-01-21 DIAGNOSIS — L814 Other melanin hyperpigmentation: Secondary | ICD-10-CM | POA: Diagnosis not present

## 2023-01-21 DIAGNOSIS — D2262 Melanocytic nevi of left upper limb, including shoulder: Secondary | ICD-10-CM | POA: Diagnosis not present

## 2023-01-21 DIAGNOSIS — L821 Other seborrheic keratosis: Secondary | ICD-10-CM | POA: Diagnosis not present

## 2023-01-21 DIAGNOSIS — D2261 Melanocytic nevi of right upper limb, including shoulder: Secondary | ICD-10-CM | POA: Diagnosis not present

## 2023-01-21 DIAGNOSIS — I8391 Asymptomatic varicose veins of right lower extremity: Secondary | ICD-10-CM | POA: Diagnosis not present

## 2023-01-21 DIAGNOSIS — D2271 Melanocytic nevi of right lower limb, including hip: Secondary | ICD-10-CM | POA: Diagnosis not present

## 2023-03-25 DIAGNOSIS — M810 Age-related osteoporosis without current pathological fracture: Secondary | ICD-10-CM | POA: Diagnosis not present

## 2023-03-25 DIAGNOSIS — Z1211 Encounter for screening for malignant neoplasm of colon: Secondary | ICD-10-CM | POA: Diagnosis not present

## 2023-03-25 DIAGNOSIS — E7800 Pure hypercholesterolemia, unspecified: Secondary | ICD-10-CM | POA: Diagnosis not present

## 2023-03-25 DIAGNOSIS — Z0000 Encounter for general adult medical examination without abnormal findings: Secondary | ICD-10-CM | POA: Diagnosis not present

## 2023-03-25 DIAGNOSIS — R350 Frequency of micturition: Secondary | ICD-10-CM | POA: Diagnosis not present

## 2023-03-25 DIAGNOSIS — Z1331 Encounter for screening for depression: Secondary | ICD-10-CM | POA: Diagnosis not present

## 2023-03-25 DIAGNOSIS — E559 Vitamin D deficiency, unspecified: Secondary | ICD-10-CM | POA: Diagnosis not present

## 2023-05-17 DIAGNOSIS — Z1231 Encounter for screening mammogram for malignant neoplasm of breast: Secondary | ICD-10-CM | POA: Diagnosis not present

## 2023-07-24 DIAGNOSIS — Z01419 Encounter for gynecological examination (general) (routine) without abnormal findings: Secondary | ICD-10-CM | POA: Diagnosis not present

## 2023-07-24 DIAGNOSIS — Z6821 Body mass index (BMI) 21.0-21.9, adult: Secondary | ICD-10-CM | POA: Diagnosis not present

## 2023-07-24 DIAGNOSIS — Z1211 Encounter for screening for malignant neoplasm of colon: Secondary | ICD-10-CM | POA: Diagnosis not present

## 2023-07-24 DIAGNOSIS — Z1231 Encounter for screening mammogram for malignant neoplasm of breast: Secondary | ICD-10-CM | POA: Diagnosis not present

## 2023-07-24 DIAGNOSIS — Z1330 Encounter for screening examination for mental health and behavioral disorders, unspecified: Secondary | ICD-10-CM | POA: Diagnosis not present

## 2023-07-24 DIAGNOSIS — M810 Age-related osteoporosis without current pathological fracture: Secondary | ICD-10-CM | POA: Diagnosis not present

## 2023-07-30 DIAGNOSIS — H2513 Age-related nuclear cataract, bilateral: Secondary | ICD-10-CM | POA: Diagnosis not present

## 2023-07-30 DIAGNOSIS — H04123 Dry eye syndrome of bilateral lacrimal glands: Secondary | ICD-10-CM | POA: Diagnosis not present

## 2023-07-30 DIAGNOSIS — H40013 Open angle with borderline findings, low risk, bilateral: Secondary | ICD-10-CM | POA: Diagnosis not present

## 2023-07-30 DIAGNOSIS — H524 Presbyopia: Secondary | ICD-10-CM | POA: Diagnosis not present

## 2023-07-30 DIAGNOSIS — H52223 Regular astigmatism, bilateral: Secondary | ICD-10-CM | POA: Diagnosis not present

## 2023-09-09 DIAGNOSIS — D2271 Melanocytic nevi of right lower limb, including hip: Secondary | ICD-10-CM | POA: Diagnosis not present

## 2023-09-09 DIAGNOSIS — D2261 Melanocytic nevi of right upper limb, including shoulder: Secondary | ICD-10-CM | POA: Diagnosis not present

## 2023-09-09 DIAGNOSIS — L720 Epidermal cyst: Secondary | ICD-10-CM | POA: Diagnosis not present

## 2023-09-09 DIAGNOSIS — L918 Other hypertrophic disorders of the skin: Secondary | ICD-10-CM | POA: Diagnosis not present

## 2023-09-09 DIAGNOSIS — D2239 Melanocytic nevi of other parts of face: Secondary | ICD-10-CM | POA: Diagnosis not present

## 2023-09-09 DIAGNOSIS — D3611 Benign neoplasm of peripheral nerves and autonomic nervous system of face, head, and neck: Secondary | ICD-10-CM | POA: Diagnosis not present

## 2023-09-09 DIAGNOSIS — D2262 Melanocytic nevi of left upper limb, including shoulder: Secondary | ICD-10-CM | POA: Diagnosis not present

## 2023-09-09 DIAGNOSIS — D225 Melanocytic nevi of trunk: Secondary | ICD-10-CM | POA: Diagnosis not present

## 2023-09-09 DIAGNOSIS — L814 Other melanin hyperpigmentation: Secondary | ICD-10-CM | POA: Diagnosis not present

## 2023-11-21 DIAGNOSIS — K649 Unspecified hemorrhoids: Secondary | ICD-10-CM | POA: Diagnosis not present

## 2023-11-21 DIAGNOSIS — K5900 Constipation, unspecified: Secondary | ICD-10-CM | POA: Diagnosis not present

## 2024-02-12 DIAGNOSIS — J069 Acute upper respiratory infection, unspecified: Secondary | ICD-10-CM | POA: Diagnosis not present

## 2024-02-12 DIAGNOSIS — U071 COVID-19: Secondary | ICD-10-CM | POA: Diagnosis not present

## 2024-02-12 DIAGNOSIS — R059 Cough, unspecified: Secondary | ICD-10-CM | POA: Diagnosis not present

## 2024-02-24 DIAGNOSIS — D2272 Melanocytic nevi of left lower limb, including hip: Secondary | ICD-10-CM | POA: Diagnosis not present

## 2024-02-24 DIAGNOSIS — D2261 Melanocytic nevi of right upper limb, including shoulder: Secondary | ICD-10-CM | POA: Diagnosis not present

## 2024-02-24 DIAGNOSIS — D2262 Melanocytic nevi of left upper limb, including shoulder: Secondary | ICD-10-CM | POA: Diagnosis not present

## 2024-02-24 DIAGNOSIS — L814 Other melanin hyperpigmentation: Secondary | ICD-10-CM | POA: Diagnosis not present

## 2024-02-24 DIAGNOSIS — L2489 Irritant contact dermatitis due to other agents: Secondary | ICD-10-CM | POA: Diagnosis not present

## 2024-02-24 DIAGNOSIS — D2271 Melanocytic nevi of right lower limb, including hip: Secondary | ICD-10-CM | POA: Diagnosis not present

## 2024-02-24 DIAGNOSIS — L438 Other lichen planus: Secondary | ICD-10-CM | POA: Diagnosis not present

## 2024-02-24 DIAGNOSIS — L821 Other seborrheic keratosis: Secondary | ICD-10-CM | POA: Diagnosis not present

## 2024-03-30 DIAGNOSIS — I83892 Varicose veins of left lower extremities with other complications: Secondary | ICD-10-CM | POA: Diagnosis not present

## 2024-03-30 DIAGNOSIS — Z0000 Encounter for general adult medical examination without abnormal findings: Secondary | ICD-10-CM | POA: Diagnosis not present

## 2024-03-30 DIAGNOSIS — Z1211 Encounter for screening for malignant neoplasm of colon: Secondary | ICD-10-CM | POA: Diagnosis not present

## 2024-03-30 DIAGNOSIS — M810 Age-related osteoporosis without current pathological fracture: Secondary | ICD-10-CM | POA: Diagnosis not present

## 2024-03-30 DIAGNOSIS — E559 Vitamin D deficiency, unspecified: Secondary | ICD-10-CM | POA: Diagnosis not present

## 2024-03-30 DIAGNOSIS — Z1331 Encounter for screening for depression: Secondary | ICD-10-CM | POA: Diagnosis not present

## 2024-03-30 DIAGNOSIS — E7800 Pure hypercholesterolemia, unspecified: Secondary | ICD-10-CM | POA: Diagnosis not present

## 2024-05-20 DIAGNOSIS — Z1231 Encounter for screening mammogram for malignant neoplasm of breast: Secondary | ICD-10-CM | POA: Diagnosis not present
